# Patient Record
Sex: Female | Born: 1975 | Race: Black or African American | Hispanic: No | Smoking: Never smoker
Health system: Southern US, Community
[De-identification: ages and names within clinical notes are randomized; demographics above are authoritative.]

## PROBLEM LIST (undated history)

## (undated) DIAGNOSIS — D332 Benign neoplasm of brain, unspecified: Secondary | ICD-10-CM

## (undated) DIAGNOSIS — R569 Unspecified convulsions: Secondary | ICD-10-CM

## (undated) DIAGNOSIS — Z5189 Encounter for other specified aftercare: Secondary | ICD-10-CM

## (undated) HISTORY — DX: Unspecified convulsions: R56.9

## (undated) HISTORY — PX: MUSCLE BIOPSY: SHX716

---

## 2004-08-04 ENCOUNTER — Emergency Department: Payer: Self-pay | Admitting: Emergency Medicine

## 2005-06-21 ENCOUNTER — Emergency Department: Payer: Self-pay | Admitting: Emergency Medicine

## 2006-04-24 ENCOUNTER — Emergency Department (HOSPITAL_COMMUNITY): Admission: EM | Admit: 2006-04-24 | Discharge: 2006-04-24 | Payer: Self-pay | Admitting: Pediatrics

## 2006-08-21 ENCOUNTER — Emergency Department: Payer: Self-pay | Admitting: Emergency Medicine

## 2007-01-24 ENCOUNTER — Emergency Department: Payer: Self-pay | Admitting: Emergency Medicine

## 2007-03-28 ENCOUNTER — Emergency Department: Payer: Self-pay | Admitting: Emergency Medicine

## 2008-02-03 ENCOUNTER — Emergency Department: Payer: Self-pay | Admitting: Emergency Medicine

## 2008-05-21 HISTORY — PX: BRAIN SURGERY: SHX531

## 2008-09-29 ENCOUNTER — Emergency Department (HOSPITAL_COMMUNITY): Admission: EM | Admit: 2008-09-29 | Discharge: 2008-09-29 | Payer: Self-pay | Admitting: Emergency Medicine

## 2009-01-29 ENCOUNTER — Emergency Department: Payer: Self-pay | Admitting: Emergency Medicine

## 2009-11-01 ENCOUNTER — Emergency Department: Payer: Self-pay | Admitting: Emergency Medicine

## 2010-06-09 ENCOUNTER — Emergency Department: Payer: Self-pay | Admitting: Emergency Medicine

## 2010-12-28 ENCOUNTER — Emergency Department (HOSPITAL_COMMUNITY)
Admission: EM | Admit: 2010-12-28 | Discharge: 2010-12-28 | Disposition: A | Discharge status: Home / Self Care | Payer: Medicare Other | Attending: Emergency Medicine | Admitting: Emergency Medicine

## 2010-12-28 DIAGNOSIS — S01502A Unspecified open wound of oral cavity, initial encounter: Secondary | ICD-10-CM | POA: Insufficient documentation

## 2010-12-28 DIAGNOSIS — G40909 Epilepsy, unspecified, not intractable, without status epilepticus: Secondary | ICD-10-CM | POA: Insufficient documentation

## 2010-12-28 DIAGNOSIS — Z79899 Other long term (current) drug therapy: Secondary | ICD-10-CM | POA: Insufficient documentation

## 2010-12-28 DIAGNOSIS — W503XXA Accidental bite by another person, initial encounter: Secondary | ICD-10-CM | POA: Insufficient documentation

## 2010-12-28 DIAGNOSIS — R6889 Other general symptoms and signs: Secondary | ICD-10-CM | POA: Insufficient documentation

## 2010-12-28 LAB — CARBAMAZEPINE LEVEL, TOTAL: Carbamazepine Lvl: <0.5 ug/mL — ABNORMAL LOW (ref 4.0–12.0)

## 2012-11-06 ENCOUNTER — Encounter: Payer: Self-pay | Admitting: General Surgery

## 2012-11-06 ENCOUNTER — Ambulatory Visit (INDEPENDENT_AMBULATORY_CARE_PROVIDER_SITE_OTHER): Payer: Medicare Other | Admitting: General Surgery

## 2012-11-06 VITALS — BP 118/82 | HR 72 | Resp 14 | Ht 65.0 in | Wt 168.0 lb

## 2012-11-06 DIAGNOSIS — N6459 Other signs and symptoms in breast: Secondary | ICD-10-CM

## 2012-11-06 DIAGNOSIS — N6452 Nipple discharge: Secondary | ICD-10-CM

## 2012-11-06 NOTE — Patient Instructions (Addendum)
Patient to return as needed. If things charge give [place] an call. Patient to resume mammograms at age 37.

## 2012-11-06 NOTE — Progress Notes (Signed)
Patient ID: Sheila Moore, female   DOB: [birth year], 37 y.o.   MRN: [id number]  Chief Complaint  Patient presents with  . Breast Discharge    new patient bilateral nipple discharge    HPI Sheila Moore is a 37 y.o. female who presents today for an evaluation of bilateral nipple discharge referred by Lyndel Pleasure PA. Patient states this has been going on for 3 months was gray at first now greenish . No family history of breast cancer. Last mammogram done at [facility] on May 20,2014 category t2. Never had any breast problems before. Patient dose perform self breast checks.  HPI  Past Medical History  Diagnosis Date  . Seizures     Past Surgical History  Procedure Laterality Date  . 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 No pertinent family history.  Social History History  Substance Use Topics  . Smoking status: Not on file  . Smokeless tobacco: Never Used  . Alcohol Use: No    No Known Allergies  Current Outpatient Prescriptions  Medication Sig Dispense Refill  . carbamazepine (CARBATROL) 300 MG 12 hr capsule Take 1 capsule by mouth daily.      Marland Kitchen levETIRAcetam (KEPPRA) 500 MG tablet Take 1 tablet by mouth 2 (two) times daily.      . QUEtiapine (SEROQUEL) 25 MG tablet Take 1 tablet by mouth daily.       No current facility-administered medications for this visit.    Review of Systems Review of Systems  Constitutional: Negative.   Respiratory: Negative.   Cardiovascular: Negative.     Blood pressure 118/82, pulse 72, resp. rate 14, height 5\' 5"  (1.651 m), weight 168 lb (76.204 kg), last menstrual period 09/24/2012.  Physical Exam Physical Exam  Constitutional: She appears well-developed and well-nourished.  Eyes: No scleral icterus.  Cardiovascular: Normal rate, regular rhythm and normal heart sounds.   Pulmonary/Chest: Breath sounds normal. Right breast exhibits no inverted nipple, no mass, no skin change and no tenderness. Nipple discharge:   dark green negative for blood when tested. Left breast exhibits no inverted nipple, no mass, no nipple discharge, no skin change and no tenderness.  Lymphadenopathy:    She has no cervical adenopathy.    She has no axillary adenopathy.  Neurological: She is alert.  Skin: Skin is warm and dry.    Data Reviewed Mammogram reviewed cat 2  Assessment     benign nipple discharge. Pt was advised on this. If it becomes more of an issue will reevaluate    Plan    Patient to return as needed.        Rebekkah Powless G 11/06/2012, 7:03 PM

## 2013-01-30 ENCOUNTER — Emergency Department (HOSPITAL_COMMUNITY)
Admission: EM | Admit: 2013-01-30 | Discharge: 2013-01-30 | Disposition: A | Discharge status: Home / Self Care | Payer: No Typology Code available for payment source | Attending: Emergency Medicine | Admitting: Emergency Medicine

## 2013-01-30 ENCOUNTER — Encounter (HOSPITAL_COMMUNITY): Payer: Self-pay | Admitting: Emergency Medicine

## 2013-01-30 ENCOUNTER — Emergency Department (HOSPITAL_COMMUNITY): Payer: No Typology Code available for payment source

## 2013-01-30 DIAGNOSIS — Y929 Unspecified place or not applicable: Secondary | ICD-10-CM | POA: Insufficient documentation

## 2013-01-30 DIAGNOSIS — S60222A Contusion of left hand, initial encounter: Secondary | ICD-10-CM

## 2013-01-30 DIAGNOSIS — Z79899 Other long term (current) drug therapy: Secondary | ICD-10-CM | POA: Insufficient documentation

## 2013-01-30 DIAGNOSIS — S60229A Contusion of unspecified hand, initial encounter: Secondary | ICD-10-CM | POA: Insufficient documentation

## 2013-01-30 DIAGNOSIS — R609 Edema, unspecified: Secondary | ICD-10-CM | POA: Insufficient documentation

## 2013-01-30 DIAGNOSIS — G40909 Epilepsy, unspecified, not intractable, without status epilepticus: Secondary | ICD-10-CM | POA: Insufficient documentation

## 2013-01-30 DIAGNOSIS — Y9389 Activity, other specified: Secondary | ICD-10-CM | POA: Insufficient documentation

## 2013-01-30 MED ORDER — HYDROCODONE-ACETAMINOPHEN 5-325 MG PO TABS
2.0000 | ORAL_TABLET | Freq: Once | ORAL | Status: AC
  Administered 2013-01-30: 2 via ORAL
  Filled 2013-01-30: qty 2

## 2013-01-30 MED ORDER — IBUPROFEN 600 MG PO TABS: 600.0000 mg | ORAL_TABLET | Freq: Four times a day (QID) | ORAL | Status: DC | PRN

## 2013-01-30 NOTE — Progress Notes (Signed)
[facility] did not get to see patient but will be sending information about the [employer] card program, using the address provided.
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
## 2014-03-22 ENCOUNTER — Encounter (HOSPITAL_COMMUNITY): Payer: Self-pay | Admitting: Emergency Medicine

## 2015-06-03 DIAGNOSIS — Z85841 Personal history of malignant neoplasm of brain: Secondary | ICD-10-CM | POA: Diagnosis not present

## 2015-06-03 DIAGNOSIS — G40119 Localization-related (focal) (partial) symptomatic epilepsy and epileptic syndromes with simple partial seizures, intractable, without status epilepticus: Secondary | ICD-10-CM | POA: Diagnosis not present

## 2015-06-03 DIAGNOSIS — G40109 Localization-related (focal) (partial) symptomatic epilepsy and epileptic syndromes with simple partial seizures, not intractable, without status epilepticus: Secondary | ICD-10-CM | POA: Diagnosis not present

## 2015-06-03 DIAGNOSIS — Z9889 Other specified postprocedural states: Secondary | ICD-10-CM | POA: Diagnosis not present

## 2016-05-19 ENCOUNTER — Emergency Department (HOSPITAL_COMMUNITY)
Admission: EM | Admit: 2016-05-19 | Discharge: 2016-05-19 | Disposition: A | Discharge status: Home / Self Care | Payer: Medicare Other | Attending: Physician Assistant | Admitting: Physician Assistant

## 2016-05-19 ENCOUNTER — Encounter (HOSPITAL_COMMUNITY): Payer: Self-pay

## 2016-05-19 DIAGNOSIS — T23001A Burn of unspecified degree of right hand, unspecified site, initial encounter: Secondary | ICD-10-CM | POA: Diagnosis present

## 2016-05-19 DIAGNOSIS — Y999 Unspecified external cause status: Secondary | ICD-10-CM | POA: Insufficient documentation

## 2016-05-19 DIAGNOSIS — Y939 Activity, unspecified: Secondary | ICD-10-CM | POA: Diagnosis not present

## 2016-05-19 DIAGNOSIS — T23202A Burn of second degree of left hand, unspecified site, initial encounter: Secondary | ICD-10-CM | POA: Diagnosis not present

## 2016-05-19 DIAGNOSIS — Y929 Unspecified place or not applicable: Secondary | ICD-10-CM | POA: Insufficient documentation

## 2016-05-19 DIAGNOSIS — T310 Burns involving less than 10% of body surface: Secondary | ICD-10-CM | POA: Diagnosis not present

## 2016-05-19 DIAGNOSIS — X102XXA Contact with fats and cooking oils, initial encounter: Secondary | ICD-10-CM | POA: Insufficient documentation

## 2016-05-19 DIAGNOSIS — T2122XA Burn of second degree of abdominal wall, initial encounter: Secondary | ICD-10-CM | POA: Insufficient documentation

## 2016-05-19 DIAGNOSIS — T23291A Burn of second degree of multiple sites of right wrist and hand, initial encounter: Secondary | ICD-10-CM | POA: Insufficient documentation

## 2016-05-19 DIAGNOSIS — T300 Burn of unspecified body region, unspecified degree: Secondary | ICD-10-CM

## 2016-05-19 DIAGNOSIS — T23201A Burn of second degree of right hand, unspecified site, initial encounter: Secondary | ICD-10-CM | POA: Diagnosis not present

## 2016-05-19 DIAGNOSIS — T23292A Burn of second degree of multiple sites of left wrist and hand, initial encounter: Secondary | ICD-10-CM | POA: Diagnosis not present

## 2016-05-19 MED ORDER — MORPHINE SULFATE (PF) 4 MG/ML IV SOLN
4.0000 mg | Freq: Once | INTRAVENOUS | Status: AC
  Administered 2016-05-19: 4 mg via INTRAVENOUS
  Filled 2016-05-19: qty 1

## 2016-05-19 MED ORDER — SILVER SULFADIAZINE 1 % EX CREA
1.0000 | TOPICAL_CREAM | Freq: Every day | CUTANEOUS | 0 refills | Status: DC
Start: 2016-05-19 — End: 2016-08-10

## 2016-05-19 MED ORDER — SILVER SULFADIAZINE 1 % EX CREA
TOPICAL_CREAM | Freq: Once | CUTANEOUS | Status: AC
  Administered 2016-05-19: 18:00:00 via TOPICAL
  Filled 2016-05-19: qty 85

## 2016-05-19 MED ORDER — ONDANSETRON HCL 4 MG/2ML IJ SOLN
4.0000 mg | Freq: Once | INTRAMUSCULAR | Status: AC
  Administered 2016-05-19: 4 mg via INTRAVENOUS
  Filled 2016-05-19: qty 2

## 2016-05-19 MED ORDER — MORPHINE SULFATE (PF) 4 MG/ML IV SOLN
4.0000 mg | Freq: Once | INTRAVENOUS | Status: AC
Start: 2016-05-19 — End: 2016-05-19
  Administered 2016-05-19: 4 mg via INTRAVENOUS
  Filled 2016-05-19: qty 1

## 2016-05-19 MED ORDER — NAPROXEN 500 MG PO TABS: 500.0000 mg | ORAL_TABLET | Freq: Two times a day (BID) | ORAL | 0 refills | Status: DC

## 2016-05-19 MED ORDER — SODIUM CHLORIDE 0.9 % IV BOLUS (SEPSIS)
1000.0000 mL | Freq: Once | INTRAVENOUS | Status: AC
  Administered 2016-05-19: 1000 mL via INTRAVENOUS

## 2016-05-19 MED ORDER — OXYCODONE-ACETAMINOPHEN 5-325 MG PO TABS: 1.0000 | ORAL_TABLET | Freq: Four times a day (QID) | ORAL | 0 refills | Status: DC | PRN
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
## 2016-05-23 ENCOUNTER — Observation Stay: Payer: Medicare Other

## 2016-05-23 ENCOUNTER — Observation Stay
Admission: EM | Admit: 2016-05-23 | Discharge: 2016-05-24 | Disposition: A | Discharge status: Home / Self Care | Payer: Medicare Other | Attending: Obstetrics & Gynecology | Admitting: Obstetrics & Gynecology

## 2016-05-23 DIAGNOSIS — Z79899 Other long term (current) drug therapy: Secondary | ICD-10-CM | POA: Insufficient documentation

## 2016-05-23 DIAGNOSIS — N939 Abnormal uterine and vaginal bleeding, unspecified: Secondary | ICD-10-CM

## 2016-05-23 DIAGNOSIS — D62 Acute posthemorrhagic anemia: Secondary | ICD-10-CM | POA: Diagnosis not present

## 2016-05-23 DIAGNOSIS — N921 Excessive and frequent menstruation with irregular cycle: Secondary | ICD-10-CM | POA: Diagnosis not present

## 2016-05-23 DIAGNOSIS — N920 Excessive and frequent menstruation with regular cycle: Secondary | ICD-10-CM | POA: Diagnosis present

## 2016-05-23 DIAGNOSIS — R569 Unspecified convulsions: Secondary | ICD-10-CM | POA: Insufficient documentation

## 2016-05-23 DIAGNOSIS — D649 Anemia, unspecified: Secondary | ICD-10-CM | POA: Diagnosis present

## 2016-05-23 HISTORY — DX: Encounter for other specified aftercare: Z51.89

## 2016-05-23 LAB — CBC WITH DIFFERENTIAL/PLATELET
BAND NEUTROPHILS: 0 %
BASOS ABS: 0 10*3/uL (ref 0–0.1)
Basophils Relative: 0 %
Blasts: 0 %
EOS ABS: 0.1 10*3/uL (ref 0–0.7)
EOS PCT: 1 %
HEMATOCRIT: 16.5 % — AB (ref 35.0–47.0)
Hemoglobin: 4.8 g/dL — CL (ref 12.0–16.0)
LYMPHS ABS: 0.9 10*3/uL — AB (ref 1.0–3.6)
Lymphocytes Relative: 13 %
MCH: 17.1 pg — ABNORMAL LOW (ref 26.0–34.0)
MCHC: 29 g/dL — AB (ref 32.0–36.0)
MCV: 58.8 fL — ABNORMAL LOW (ref 80.0–100.0)
METAMYELOCYTES PCT: 0 %
MONO ABS: 0.9 10*3/uL (ref 0.2–0.9)
MYELOCYTES: 0 %
Monocytes Relative: 13 %
Neutro Abs: 4.8 10*3/uL (ref 1.4–6.5)
Neutrophils Relative %: 73 %
Other: 0 %
PLATELETS: 352 10*3/uL (ref 150–440)
Promyelocytes Absolute: 0 %
RBC: 2.8 MIL/uL — AB (ref 3.80–5.20)
RDW: 18.4 % — AB (ref 11.5–14.5)
Smear Review: ADEQUATE
WBC: 6.7 10*3/uL (ref 3.6–11.0)
nRBC: 0 /100 WBC

## 2016-05-23 LAB — BASIC METABOLIC PANEL
ANION GAP: 8 (ref 5–15)
BUN: 10 mg/dL (ref 6–20)
CALCIUM: 9.6 mg/dL (ref 8.9–10.3)
CO2: 25 mmol/L (ref 22–32)
CREATININE: 0.68 mg/dL (ref 0.44–1.00)
Chloride: 103 mmol/L (ref 101–111)
Glucose, Bld: 116 mg/dL — ABNORMAL HIGH (ref 65–99)
Potassium: 3.6 mmol/L (ref 3.5–5.1)
SODIUM: 136 mmol/L (ref 135–145)

## 2016-05-23 LAB — ABO/RH: ABO/RH(D): O POS

## 2016-05-23 LAB — HCG, QUANTITATIVE, PREGNANCY: hCG, Beta Chain, Quant, S: <1 m[IU]/mL (ref <5)

## 2016-05-23 MED ORDER — LEVETIRACETAM 500 MG PO TABS
500.0000 mg | ORAL_TABLET | Freq: Two times a day (BID) | ORAL | Status: DC
  Administered 2016-05-23 – 2016-05-24 (×2): 500 mg via ORAL
  Filled 2016-05-23 (×2): qty 1

## 2016-05-23 MED ORDER — CARBAMAZEPINE ER 100 MG PO TB12
300.0000 mg | ORAL_TABLET | Freq: Every day | ORAL | Status: DC
  Administered 2016-05-23 – 2016-05-24 (×2): 300 mg via ORAL
  Filled 2016-05-23: qty 1
  Filled 2016-05-23 (×2): qty 3

## 2016-05-23 MED ORDER — ACETAMINOPHEN 325 MG PO TABS
650.0000 mg | ORAL_TABLET | Freq: Four times a day (QID) | ORAL | Status: DC | PRN
  Administered 2016-05-23: 650 mg via ORAL
  Filled 2016-05-23: qty 2

## 2016-05-23 MED ORDER — SODIUM CHLORIDE 0.9 % IV SOLN
10.0000 mL/h | Freq: Once | INTRAVENOUS | Status: DC
  Administered 2016-05-23: 10 mL/h via INTRAVENOUS

## 2016-05-23 MED ORDER — IBUPROFEN 600 MG PO TABS: 600.0000 mg | ORAL_TABLET | Freq: Four times a day (QID) | ORAL | Status: DC | PRN

## 2016-05-23 MED ORDER — SIMETHICONE 80 MG PO CHEW: 80.0000 mg | CHEWABLE_TABLET | Freq: Four times a day (QID) | ORAL | Status: DC | PRN

## 2016-05-23 MED ORDER — SILVER SULFADIAZINE 1 % EX CREA
1.0000 "application " | TOPICAL_CREAM | Freq: Every day | CUTANEOUS | Status: DC
  Administered 2016-05-23: 1 via TOPICAL
  Filled 2016-05-23: qty 85

## 2016-05-23 MED ORDER — ALUM & MAG HYDROXIDE-SIMETH 200-200-20 MG/5ML PO SUSP: 30.0000 mL | ORAL | Status: DC | PRN

## 2016-05-23 MED ORDER — LACTATED RINGERS IV SOLN
INTRAVENOUS | Status: DC
  Administered 2016-05-23: 23:00:00 via INTRAVENOUS

## 2016-05-23 MED ORDER — MEDROXYPROGESTERONE ACETATE 10 MG PO TABS
10.0000 mg | ORAL_TABLET | Freq: Two times a day (BID) | ORAL | Status: DC
  Administered 2016-05-23 – 2016-05-24 (×2): 10 mg via ORAL
  Filled 2016-05-23 (×3): qty 1

## 2016-05-23 MED ORDER — DIPHENHYDRAMINE HCL 25 MG PO CAPS
25.0000 mg | ORAL_CAPSULE | Freq: Four times a day (QID) | ORAL | Status: DC | PRN
  Administered 2016-05-23: 25 mg via ORAL
  Filled 2016-05-23: qty 1

## 2016-05-23 MED ORDER — FERROUS FUMARATE 324 (106 FE) MG PO TABS
1.0000 | ORAL_TABLET | Freq: Two times a day (BID) | ORAL | Status: DC
  Administered 2016-05-23 – 2016-05-24 (×2): 106 mg via ORAL
  Filled 2016-05-23 (×2): qty 1

## 2016-05-23 NOTE — H&P (Signed)
Obstetrics & Gynecology H&P Note  Date of Admission: 05/23/2016   Requesting Provider: [facility]  Primary OBGYN: None, new to area Primary Care Provider: No PCP Per Patient  CC: Anemia and menometrorrhagia  History of Present Illness: Sheila Moore is a 41 y.o. [id number] (Patient's last menstrual period was 04/17/2016.), with the above CC. She reports reg cycles until November at which time she has noted daily bleeding from Nov 28th until now; some days worse than others, sometimes clots, often bright red blood, pads daily; mild cramping; recent 5 days of more dizziness and fatigue.  No prior h/o fibroids, AUB, other bleeding.  Prior NSVD 20 yrs ago and CS w BTL 13 yrs ago.  Developed seizure d/o with first pregnancy that has persisted but is currently well controlled.  ROS: A 12-point review of systems was performed and negative, except as stated in the above HPI.  OBGYN History: As per HPI. OB History    Gravida Para Term Preterm AB Living   2 2  1  1  2    SAB TAB Ectopic Multiple Live Births                 Obstetric Comments   1st Menstrual Cycle:  11 1st Pregnancy:  19     no history of STIs.   She is currently using BTL for contraception.  no HRT use.    Past Medical History: Past Medical History:  Diagnosis Date  . Blood transfusion without reported diagnosis   . Seizures ([place])     Past Surgical History: Past Surgical History:  Procedure Laterality Date  . BRAIN SURGERY  2010   tumor removed   - Cesarean and Tubal  Family History:  No family history on file. She denies any female cancers, bleeding or blood clotting disorders.   Social History:  Social History   Social History  . Marital status: Married    Spouse name:   . Number of children: 2  . Years of education: N/A   Occupational History  . Not on file.   Social History Main Topics  . Smoking status: Never Smoker  . Smokeless tobacco: Never Used  . Alcohol use No  . Drug use: No  . Sexual  activity: Not on file   Allergy: No Known Allergies  Current Outpatient Medications:  (Not in a hospital admission)   Hospital Medications: Current Facility-Administered Medications  Medication Dose Route Frequency Provider Last Rate Last Dose  . alum & mag hydroxide-simeth (MAALOX/MYLANTA) 200-200-20 MG/5ML suspension 30 mL  30 mL Oral Q4H PRN Gae Dry, MD      . carbamazepine (CARBATROL) 12 hr capsule 300 mg  300 mg Oral Daily Gae Dry, MD      . Ferrous Fumarate (HEMOCYTE - 106 mg FE) tablet 106 mg of iron  1 tablet Oral BID Gae Dry, MD      . ibuprofen (ADVIL,MOTRIN) tablet 600 mg  600 mg Oral Q6H PRN Gae Dry, MD      . lactated ringers infusion   Intravenous Continuous Gae Dry, MD      . levETIRAcetam (KEPPRA) tablet 500 mg  500 mg Oral BID Gae Dry, MD      . medroxyPROGESTERone (PROVERA) tablet 10 mg  10 mg Oral BID Gae Dry, MD      . silver sulfADIAZINE (SILVADENE) 1 % cream 1 application  1 application Topical Daily Gae Dry, MD      .  simethicone (MYLICON) chewable tablet 80 mg  80 mg Oral QID PRN Gae Dry, MD       Current Outpatient Prescriptions  Medication Sig Dispense Refill  . carbamazepine (CARBATROL) 300 MG 12 hr capsule Take 1 capsule by mouth daily.    Marland Kitchen ibuprofen (ADVIL,MOTRIN) 600 MG tablet Take 1 tablet (600 mg total) by mouth every 6 (six) hours as needed for pain. 30 tablet 0  . levETIRAcetam (KEPPRA) 500 MG tablet Take 500 mg by mouth 2 (two) times daily.     . naproxen (NAPROSYN) 500 MG tablet Take 1 tablet (500 mg total) by mouth 2 (two) times daily. 30 tablet 0  . naproxen sodium (ANAPROX) 220 MG tablet Take 220 mg by mouth daily as needed (for pain).    Marland Kitchen oxyCODONE-acetaminophen (PERCOCET/ROXICET) 5-325 MG tablet Take 1-2 tablets by mouth every 6 (six) hours as needed for severe pain. 30 tablet 0  . silver sulfADIAZINE (SILVADENE) 1 % cream Apply 1 application topically daily. 50 g 0      Physical Exam: Vitals:   05/23/16 1335 05/23/16 1336  BP: (!) 159/88   Pulse: 96   Resp: 16   Temp: 98.2 F (36.8 C)   TempSrc: Oral   SpO2: 100%   Weight:  175 lb (79.4 kg)  Height:  5\' 5"  (1.651 m)   Body mass index is 29.12 kg/m. General appearance: Well nourished, well developed female in no acute distress.  Neck:  Supple, normal appearance, and no thyromegaly  Cardiovascular:Regular rate and rhythm.  No murmurs, rubs or gallops. Respiratory:  Clear to auscultation bilateral. Normal respiratory effort Abdomen: positive bowel sounds and no masses, hernias; diffusely non tender to palpation, non distended Neuro/Psych:  Normal mood and affect.  Skin:  Warm and dry.  Lymphatic:  No inguinal lymphadenopathy.   Recent Labs Lab 05/23/16 1338  WBC 6.7  HGB 4.8*  HCT 16.5*  PLT 352    Recent Labs Lab 05/23/16 1338  NA 136  K 3.6  CL 103  CO2 25  BUN 10  CREATININE 0.68  CALCIUM 9.6  GLUCOSE 116*    Recent Labs Lab 05/23/16 1434  [place] O POS    Imaging:  [place] pending  Assessment: Sheila Moore is a 41 y.o. [id number] (Patient's last menstrual period was 04/17/2016.) who presented to the ED with complaints of menometrorrhagia and dizziness; findings are consistent with anemia.  Hormonal vs anatomical etiology for menometrorrhagia discussed.  [place] to help define.  Provera to short term treat.  Transfusion for severe anemia.  Barnett Applebaum, MD [facility] OBGYN Pager [phone]

## 2016-05-23 NOTE — ED Notes (Signed)
Resumed care from Counce rn.  Pt alert.  Iv in place. nsr on monitor.  Skin warm and dry.  No acute resp distress.  No chest pain.

## 2016-05-23 NOTE — ED Provider Notes (Signed)
[facility] Emergency Department Provider Note  ____________________________________________  Time seen: Approximately 2:48 PM  I have reviewed the triage vital signs and the nursing notes.   HISTORY  Chief Complaint Dizziness and Vaginal Bleeding   HPI Sheila Moore is a 41 y.o. female G2P2 resents for evaluation of dizziness and dyspnea on exertion in the setting of vaginal bleeding. Patient reports greater than 30 days of constant heavy bright red vaginal bleeding with intermittent large clots. She denies prior history of abnormal menstrual periods. She reports that she is passing large clots. For the last 5 days she has had fatigue, dizziness, and today started having dyspnea on exertion. Not on OCP. No CP, no abdominal pain.   Past Medical History:  Diagnosis Date  . Blood transfusion without reported diagnosis   . Seizures [facility])     Patient Active Problem List   Diagnosis Date Noted  . Menorrhagia 05/23/2016  . Anemia 05/23/2016  . Nipple discharge 11/06/2012    Past Surgical History:  Procedure Laterality Date  . BRAIN SURGERY  2010   tumor removed    Prior to Admission medications   Medication Sig Start Date End Date Taking? Authorizing Provider  carbamazepine (CARBATROL) 300 MG 12 hr capsule Take 1 capsule by mouth daily. 10/27/12  Yes Historical Provider, MD  ibuprofen (ADVIL,MOTRIN) 600 MG tablet Take 1 tablet (600 mg total) by mouth every 6 (six) hours as needed for pain. 01/30/13  Yes Ernestina Patches, MD  levETIRAcetam (KEPPRA) 500 MG tablet Take 500 mg by mouth 2 (two) times daily.  10/28/12  Yes Historical Provider, MD  silver sulfADIAZINE (SILVADENE) 1 % cream Apply 1 application topically daily. 05/19/16  Yes Shawn C Joy, PA-C    Allergies Patient has no known allergies.  No family history on file.  Social History Social History  Substance Use Topics  . Smoking status: Never Smoker  . Smokeless tobacco: Never Used  .  Alcohol use No    Review of Systems  Constitutional: Negative for fever. + fatigue and dizziness Eyes: Negative for visual changes. ENT: Negative for sore throat. Neck: No neck pain  Cardiovascular: Negative for chest pain. Respiratory: Negative for shortness of breath. Gastrointestinal: Negative for abdominal pain, vomiting or diarrhea. Genitourinary: Negative for dysuria. + vaginal bleeding Musculoskeletal: Negative for back pain. Skin: Negative for rash. Neurological: Negative for headaches, weakness or numbness. Psych: No SI or HI  ____________________________________________   PHYSICAL EXAM:  VITAL SIGNS: ED Triage Vitals  Enc Vitals Group     BP 05/23/16 1335 (!) 159/88     Pulse Rate 05/23/16 1335 96     Resp 05/23/16 1335 16     Temp 05/23/16 1335 98.2 F (36.8 C)     Temp Source 05/23/16 1335 Oral     SpO2 05/23/16 1335 100 %     Weight 05/23/16 1336 175 lb (79.4 kg)     Height 05/23/16 1336 5\' 5"  (1.651 m)     Head Circumference --      Peak Flow --      Pain Score 05/23/16 1336 7     Pain Loc --      Pain Edu? --      Excl. in [place]? --     Constitutional: Alert and oriented. Well appearing and in no apparent distress. HEENT:      Head: Normocephalic and atraumatic.         Eyes: Conjunctivae are normal. Sclera is non-icteric. EOMI. PERRL  Mouth/Throat: Mucous membranes are moist.       Neck: Supple with no signs of meningismus. Cardiovascular: Regular rate and rhythm. No murmurs, gallops, or rubs. 2+ symmetrical distal pulses are present in all extremities. No JVD. Respiratory: Normal respiratory effort. Lungs are clear to auscultation bilaterally. No wheezes, crackles, or rhonchi.  Gastrointestinal: Soft, non tender, and non distended with positive bowel sounds. No rebound or guarding. Musculoskeletal: Nontender with normal range of motion in all extremities. No edema, cyanosis, or erythema of extremities. Neurologic: Normal speech and language. Face  is symmetric. Moving all extremities. No gross focal neurologic deficits are appreciated. Skin: Skin is warm, dry and intact. No rash noted. Psychiatric: Mood and affect are normal. Speech and behavior are normal.  ____________________________________________   LABS (all labs ordered are listed, but only abnormal results are displayed)  Labs Reviewed  CBC WITH DIFFERENTIAL/PLATELET - Abnormal; Notable for the following:       Result Value   RBC 2.80 (*)    Hemoglobin 4.8 (*)    HCT 16.5 (*)    MCV 58.8 (*)    MCH 17.1 (*)    MCHC 29.0 (*)    RDW 18.4 (*)    Lymphs Abs 0.9 (*)    All other components within normal limits  BASIC METABOLIC PANEL - Abnormal; Notable for the following:    Glucose, Bld 116 (*)    All other components within normal limits  HCG, QUANTITATIVE, PREGNANCY  CBC  TYPE AND SCREEN  PREPARE RBC (CROSSMATCH)  ABO/RH   ____________________________________________  EKG  none ____________________________________________  RADIOLOGY  TVUS: PND ____________________________________________   PROCEDURES  Procedure(s) performed:None Procedures Critical Care performed: yes  CRITICAL CARE Performed by: Rudene Re  ?  Total critical care time: 35 min  Critical care time was exclusive of separately billable procedures and treating other patients.  Critical care was necessary to treat or prevent imminent or life-threatening deterioration.  Critical care was time spent personally by me on the following activities: development of treatment plan with patient and/or surrogate as well as nursing, discussions with consultants, evaluation of patient's response to treatment, examination of patient, obtaining history from patient or surrogate, ordering and performing treatments and interventions, ordering and review of laboratory studies, ordering and review of radiographic studies, pulse oximetry and re-evaluation of patient's  condition.  ____________________________________________   INITIAL IMPRESSION / ASSESSMENT AND PLAN / ED COURSE  41 y.o. female G2P2 resents for evaluation of dizziness and dyspnea on exertion in the setting of vaginal bleeding > 30 days. VS WNL. Hgb 4.8. Patient consented for transfusion. Will give 2U. Discussed with Dr. Kenton Kingfisher, obgyn who    Clinical Course     Pertinent labs & imaging results that were available during my care of the patient were reviewed by me and considered in my medical decision making (see chart for details).    ____________________________________________   FINAL CLINICAL IMPRESSION(S) / ED DIAGNOSES  Final diagnoses:  Vaginal bleeding  Acute blood loss anemia      NEW MEDICATIONS STARTED DURING THIS VISIT:  Current Discharge Medication List       Note:  This document was prepared using Dragon voice recognition software and may include unintentional dictation errors.    Rudene Re, MD 05/23/16 2002

## 2016-05-23 NOTE — ED Notes (Addendum)
Nurse unable to take report.  Er charge nurse heather rn aware.

## 2016-05-23 NOTE — ED Triage Notes (Signed)
Pt reports menstrual cycle since November. Pt reports weakness and fatigue X 5 days. Pt pale.

## 2016-05-23 NOTE — ED Notes (Signed)
Report called to 3rd floor nurse christy rn.

## 2016-05-23 NOTE — ED Notes (Signed)
EDP at bedside  

## 2016-05-24 DIAGNOSIS — D62 Acute posthemorrhagic anemia: Secondary | ICD-10-CM | POA: Diagnosis not present

## 2016-05-24 DIAGNOSIS — N921 Excessive and frequent menstruation with irregular cycle: Secondary | ICD-10-CM | POA: Diagnosis not present

## 2016-05-24 LAB — CBC
HEMATOCRIT: 22.1 % — AB (ref 35.0–47.0)
Hemoglobin: 6.9 g/dL — ABNORMAL LOW (ref 12.0–16.0)
MCH: 20.8 pg — ABNORMAL LOW (ref 26.0–34.0)
MCHC: 31.3 g/dL — AB (ref 32.0–36.0)
MCV: 66.4 fL — ABNORMAL LOW (ref 80.0–100.0)
PLATELETS: 272 10*3/uL (ref 150–440)
RBC: 3.32 MIL/uL — ABNORMAL LOW (ref 3.80–5.20)
RDW: 25.6 % — AB (ref 11.5–14.5)
WBC: 6.8 10*3/uL (ref 3.6–11.0)

## 2016-05-24 MED ORDER — FERROUS FUMARATE 324 (106 FE) MG PO TABS: 1.0000 | ORAL_TABLET | Freq: Two times a day (BID) | ORAL | 2 refills | Status: DC

## 2016-05-24 MED ORDER — MEDROXYPROGESTERONE ACETATE 10 MG PO TABS: 10.0000 mg | ORAL_TABLET | Freq: Every day | ORAL | 1 refills | Status: DC

## 2016-05-24 NOTE — Discharge Summary (Signed)
Physician Discharge Summary  Patient ID: Sheila Moore MRN: [id number] DOB/AGE: Oct 09, 1975 41 y.o.  Admit date: 05/23/2016 Discharge date: 05/24/2016  Admission Diagnoses: Anemia, Menometrorrhagia  Discharge Diagnoses:  Principal Problem:   Menorrhagia Active Problems:   Anemia   Discharged Condition: good  Hospital Course: Pt seen and examined for anemia and menometrorrhagia.  US done (no report but I reviewed images and no apparent uterine abnormalities),.  Received transfusion 2 units.  Hgb 6.9 this am. Provera started.  Fe.  Follow up disucssed.  Consults: None  Significant Diagnostic Studies: labs: see labs  Treatments: IV hydration and transfusion  Discharge Exam: Blood pressure 104/71, pulse 67, temperature 98.3 F (36.8 C), temperature source Oral, resp. rate 18, height 5\' 5"  (1.651 m), weight 175 lb (79.4 kg), last menstrual period 04/17/2016, SpO2 99 %. General appearance: alert, cooperative and no distress Resp: clear to auscultation bilaterally Cardio: regular rate and rhythm, S1, S2 normal, no murmur, click, rub or gallop GI: soft, non-tender; bowel sounds normal; no masses,  no organomegaly Extremities: extremities normal, atraumatic, no cyanosis or edema Pulses: 2+ and symmetric  Disposition: 01-Home or Self Care   Allergies as of 05/24/2016   No Known Allergies     Medication List    TAKE these medications   carbamazepine 300 MG 12 hr capsule Commonly known as:  CARBATROL Take 1 capsule by mouth daily.   Ferrous Fumarate 324 (106 Fe) MG Tabs tablet Commonly known as:  HEMOCYTE - 106 mg FE Take 1 tablet (106 mg of iron total) by mouth 2 (two) times daily.   ibuprofen 600 MG tablet Commonly known as:  ADVIL,MOTRIN Take 1 tablet (600 mg total) by mouth every 6 (six) hours as needed for pain.   levETIRAcetam 500 MG tablet Commonly known as:  KEPPRA Take 500 mg by mouth 2 (two) times daily.   medroxyPROGESTERone 10 MG tablet Commonly known as:   PROVERA Take 1 tablet (10 mg total) by mouth daily.   silver sulfADIAZINE 1 % cream Commonly known as:  SILVADENE Apply 1 application topically daily.        Signed: Hoyt Koch 05/24/2016, 7:33 AM

## 2016-05-24 NOTE — Discharge Instructions (Signed)
Menorrhagia Menorrhagia is when your menstrual periods are heavy or last longer than usual. Follow these instructions at home:  Only take medicine as told by your doctor.  Take any iron pills as told by your doctor. Heavy bleeding may cause low levels of iron in your body.  Do not take aspirin 1 week before or during your period. Aspirin can make the bleeding worse.  Lie down for a while if you change your tampon or pad more than once in 2 hours. This may help lessen the bleeding.  Eat a healthy diet and foods with iron. These foods include leafy green vegetables, meat, liver, eggs, and whole grain breads and cereals.  Do not try to lose weight. Wait until the heavy bleeding has stopped and your iron level is normal. Contact a doctor if:  You soak through a pad or tampon every 1 or 2 hours, and this happens every time you have a period.  You need to use pads and tampons at the same time because you are bleeding so much.  You need to change your pad or tampon during the night.  You have a period that lasts for more than 8 days.  You pass clots bigger than 1 inch (2.5 cm) wide.  You have irregular periods that happen more or less often than once a month.  You feel dizzy or pass out (faint).  You feel very weak or tired.  You feel short of breath or feel your heart is beating too fast when you exercise.  You feel sick to your stomach (nausea) and you throw up (vomit) while you are taking your medicine.  You have watery poop (diarrhea) while you are taking your medicine.  You have any problems that may be related to the medicine you are taking. Get help right away if:  You soak through 4 or more pads or tampons in 2 hours.  You have any bleeding while you are pregnant. This information is not intended to replace advice given to you by your health care provider. Make sure you discuss any questions you have with your health care provider. Document Released: 02/14/2008 Document  Revised: 10/13/2015 Document Reviewed: 11/06/2012 Elsevier Interactive Patient Education  2017 [employer]  

## 2016-05-24 NOTE — Progress Notes (Signed)
Discharge order received from doctor. Reviewed discharge instructions and prescriptions with patient and answered all questions. Follow up appointment instructions given. Patient verbalized understanding. Patient discharged home in stable condition.    Hilbert Bible, RN

## 2016-05-25 LAB — PREPARE RBC (CROSSMATCH)

## 2016-05-26 LAB — TYPE AND SCREEN
BLOOD PRODUCT EXPIRATION DATE: 201801232359
BLOOD PRODUCT EXPIRATION DATE: 201801232359
Blood Product Expiration Date: 201801232359
ISSUE DATE / TIME: 201801031738
ISSUE DATE / TIME: 201801032017
ISSUE DATE / TIME: 201801061141
UNIT TYPE AND RH: 5100
Unit Type and Rh: 5100
Unit Type and Rh: 5100

## 2016-08-10 ENCOUNTER — Encounter (HOSPITAL_COMMUNITY): Payer: Self-pay | Admitting: *Deleted

## 2016-08-10 ENCOUNTER — Emergency Department (HOSPITAL_COMMUNITY)
Admission: EM | Admit: 2016-08-10 | Discharge: 2016-08-10 | Disposition: A | Discharge status: Home / Self Care | Payer: Medicare Other | Attending: Emergency Medicine | Admitting: Emergency Medicine

## 2016-08-10 ENCOUNTER — Emergency Department (HOSPITAL_COMMUNITY): Payer: Medicare Other

## 2016-08-10 DIAGNOSIS — R519 Headache, unspecified: Secondary | ICD-10-CM

## 2016-08-10 DIAGNOSIS — R51 Headache: Secondary | ICD-10-CM | POA: Insufficient documentation

## 2016-08-10 DIAGNOSIS — D6489 Other specified anemias: Secondary | ICD-10-CM

## 2016-08-10 DIAGNOSIS — D649 Anemia, unspecified: Secondary | ICD-10-CM | POA: Insufficient documentation

## 2016-08-10 DIAGNOSIS — Z79899 Other long term (current) drug therapy: Secondary | ICD-10-CM | POA: Insufficient documentation

## 2016-08-10 DIAGNOSIS — R569 Unspecified convulsions: Secondary | ICD-10-CM | POA: Diagnosis not present

## 2016-08-10 LAB — CBC WITH DIFFERENTIAL/PLATELET
Basophils Absolute: 0.1 10*3/uL (ref 0.0–0.1)
Basophils Relative: 1 %
EOS ABS: 0.1 10*3/uL (ref 0.0–0.7)
EOS PCT: 1 %
HCT: 23.1 % — ABNORMAL LOW (ref 36.0–46.0)
HEMOGLOBIN: 6 g/dL — AB (ref 12.0–15.0)
LYMPHS PCT: 15 %
Lymphs Abs: 1 10*3/uL (ref 0.7–4.0)
MCH: 16.8 pg — ABNORMAL LOW (ref 26.0–34.0)
MCHC: 26 g/dL — ABNORMAL LOW (ref 30.0–36.0)
MCV: 64.7 fL — ABNORMAL LOW (ref 78.0–100.0)
MONO ABS: 0.9 10*3/uL (ref 0.1–1.0)
Monocytes Relative: 14 %
NEUTROS PCT: 69 %
Neutro Abs: 4.3 10*3/uL (ref 1.7–7.7)
PLATELETS: 232 10*3/uL (ref 150–400)
RBC: 3.57 MIL/uL — AB (ref 3.87–5.11)
RDW: 19.4 % — ABNORMAL HIGH (ref 11.5–15.5)
WBC: 6.4 10*3/uL (ref 4.0–10.5)

## 2016-08-10 LAB — TYPE AND SCREEN
ABO/RH(D): O POS
Antibody Screen: NEGATIVE

## 2016-08-10 LAB — BASIC METABOLIC PANEL
ANION GAP: 10 (ref 5–15)
BUN: 7 mg/dL (ref 6–20)
CHLORIDE: 105 mmol/L (ref 101–111)
CO2: 23 mmol/L (ref 22–32)
CREATININE: 0.7 mg/dL (ref 0.44–1.00)
Calcium: 9.2 mg/dL (ref 8.9–10.3)
GFR calc non Af Amer: >60 mL/min (ref >60)
Glucose, Bld: 95 mg/dL (ref 65–99)
Potassium: 3.9 mmol/L (ref 3.5–5.1)
SODIUM: 138 mmol/L (ref 135–145)

## 2016-08-10 LAB — I-STAT BETA HCG BLOOD, ED (MC, WL, AP ONLY)

## 2016-08-10 LAB — ABO/RH: ABO/RH(D): O POS

## 2016-08-10 MED ORDER — METOCLOPRAMIDE HCL 5 MG/ML IJ SOLN
10.0000 mg | Freq: Once | INTRAMUSCULAR | Status: AC
  Administered 2016-08-10: 10 mg via INTRAVENOUS
  Filled 2016-08-10: qty 2

## 2016-08-10 MED ORDER — FERROUS SULFATE 325 (65 FE) MG PO TABS: 325.0000 mg | ORAL_TABLET | Freq: Every day | ORAL | 0 refills | Status: DC

## 2016-08-10 MED ORDER — DIPHENHYDRAMINE HCL 50 MG/ML IJ SOLN
25.0000 mg | Freq: Once | INTRAMUSCULAR | Status: AC
  Administered 2016-08-10: 25 mg via INTRAVENOUS
  Filled 2016-08-10: qty 1

## 2016-08-10 MED ORDER — SODIUM CHLORIDE 0.9 % IV BOLUS (SEPSIS)
1000.0000 mL | Freq: Once | INTRAVENOUS | Status: AC
  Administered 2016-08-10: 1000 mL via INTRAVENOUS

## 2016-08-10 MED ORDER — DOCUSATE SODIUM 100 MG PO CAPS: 100.0000 mg | ORAL_CAPSULE | Freq: Two times a day (BID) | ORAL | 0 refills | Status: DC

## 2016-08-10 NOTE — ED Triage Notes (Addendum)
Pt reports hx of seizures, taking all her meds as prescribed. Reports having head pressure/tightness and change in vision this am and seizure x 2 this morning witnessed by family. Hx of brain surgery, tumor removed in 2010.

## 2016-08-10 NOTE — ED Provider Notes (Signed)
[place] DEPT Provider Note   CSN: [id number] Arrival date & time: 08/10/16  1141     History   Chief Complaint Chief Complaint  Patient presents with  . Seizures    HPI Sheila Moore is a 41 y.o. female.  Sheila Moore is a 41 y.o. Female with a history of seizures and brain tumor who presents to the ED after two seizures prior to arrival. Patient reports she has a long history of seizures and prior to having a brain tumor removed she would have up to 5 seizures a day. She reports having a brain tumor removed approximately 8 years ago and her seizures greatly reduced. She is on Keppra for seizures and is followed by a neurologist Dr. Wrenly Kendall at [facility]. She reports today she had 2 grand mal seizures. She typically only has a seizure every other week. She tells me these seizures were different today because she felt pressure in her head, and double vision. She reports the double vision has resolved, but she still reports a fontal headache. She denies falling or any injury from the seizures. She denies any changes to her medications. She reports feeling lightheaded with position change today. She denies fevers, recent illness, abdominal pain, nausea, vomiting, eye pain, neck pain, chest pain, shortness of breath, urinary symptoms, or rashes.   The history is provided by the patient and medical records. No language interpreter was used.  Seizures   Associated symptoms include headaches and visual disturbances. Pertinent negatives include no speech difficulty, no sore throat, no chest pain, no cough, no nausea, no vomiting and no diarrhea.    Past Medical History:  Diagnosis Date  . Blood transfusion without reported diagnosis   . Seizures [facility])     Patient Active Problem List   Diagnosis Date Noted  . Menorrhagia 05/23/2016  . Anemia 05/23/2016  . Nipple discharge 11/06/2012    Past Surgical History:  Procedure Laterality Date  . BRAIN SURGERY  2010   tumor  removed    OB History    Gravida Para Term Preterm AB Living   2 1     1 1    SAB TAB Ectopic Multiple Live Births   1              Obstetric Comments   1st Menstrual Cycle:  11 1st Pregnancy:  19       Home Medications    Prior to Admission medications   Medication Sig Start Date End Date Taking? Authorizing Provider  carbamazepine (CARBATROL) 300 MG 12 hr capsule Take 600 mg by mouth 2 (two) times daily. 06/03/15  Yes Historical Provider, MD  levETIRAcetam (KEPPRA) 500 MG tablet Take 750-1,000 mg by mouth See admin instructions. Takes 2 tabs in am and 1.5 tabs in pm 06/03/15  Yes Historical Provider, MD  Vitamin D, Ergocalciferol, (DRISDOL) 50000 units CAPS capsule Take 50,000 Units by mouth every Monday. 06/08/15  Yes Historical Provider, MD  docusate sodium (COLACE) 100 MG capsule Take 1 capsule (100 mg total) by mouth every 12 (twelve) hours. 08/10/16   Waynetta Pean, PA-C  Ferrous Fumarate (HEMOCYTE - 106 MG FE) 324 (106 Fe) MG TABS tablet Take 1 tablet (106 mg of iron total) by mouth 2 (two) times daily. Patient not taking: Reported on 08/10/2016 05/24/16   Gae Dry, MD  ferrous sulfate 325 (65 FE) MG tablet Take 1 tablet (325 mg total) by mouth daily. 08/10/16   Waynetta Pean, PA-C  Family History History reviewed. No pertinent family history.  Social History Social History  Substance Use Topics  . Smoking status: Never Smoker  . Smokeless tobacco: Never Used  . Alcohol use No     Allergies   Patient has no known allergies.   Review of Systems Review of Systems  Constitutional: Negative for chills and fever.  HENT: Negative for congestion and sore throat.   Eyes: Positive for visual disturbance. Negative for photophobia, pain and redness.  Respiratory: Negative for cough, shortness of breath and wheezing.   Cardiovascular: Negative for chest pain and palpitations.  Gastrointestinal: Negative for abdominal pain, diarrhea, nausea and vomiting.    Genitourinary: Negative for dysuria.  Musculoskeletal: Negative for back pain and neck pain.  Skin: Negative for rash.  Neurological: Positive for seizures, light-headedness and headaches. Negative for dizziness, syncope, speech difficulty, weakness and numbness.     Physical Exam Updated Vital Signs BP 119/82   Pulse 89   Temp 99.2 F (37.3 C) (Oral)   Resp (!) 22   Ht 5\' 5"  (1.651 m)   Wt 74.8 kg   LMP 07/27/2016   SpO2 100%   BMI 27.46 kg/m   Physical Exam  Constitutional: She is oriented to person, place, and time. She appears well-developed and well-nourished. No distress.  Nontoxic appearing.  HENT:  Head: Normocephalic and atraumatic.  Right Ear: External ear normal.  Left Ear: External ear normal.  Mouth/Throat: Oropharynx is clear and moist.  Mild clear middle ear effusion noted bilaterally without TM erythema or loss of landmarks. No temporal edema or tenderness.   Eyes: Conjunctivae and EOM are normal. Pupils are equal, round, and reactive to light. Right eye exhibits no discharge. Left eye exhibits no discharge.  Vision is grossly intact. EOMs intact.   Neck: Normal range of motion. Neck supple. No JVD present. No tracheal deviation present.  Cardiovascular: Normal rate, regular rhythm, normal heart sounds and intact distal pulses.  Exam reveals no gallop and no friction rub.   No murmur heard. Pulmonary/Chest: Effort normal and breath sounds normal. No stridor. No respiratory distress. She has no wheezes. She has no rales.  Abdominal: Soft. There is no tenderness. There is no guarding.  Musculoskeletal: Normal range of motion. She exhibits no edema or tenderness.  Lymphadenopathy:    She has no cervical adenopathy.  Neurological: She is alert and oriented to person, place, and time. No cranial nerve deficit or sensory deficit. She exhibits normal muscle tone. Coordination normal.  She is alert and oriented 3. Cranial nerves are intact. Speech is clear and  coherent. EOMs are intact. Vision is grossly intact. No pronator drift. Finger-to-nose intact bilaterally. Sensation is intact in her bilateral upper and lower extremities. Normal gait.   Skin: Skin is warm and dry. Capillary refill takes less than 2 seconds. No rash noted. She is not diaphoretic. No erythema. No pallor.  Psychiatric: She has a normal mood and affect. Her behavior is normal.  Nursing note and vitals reviewed.    ED Treatments / Results  Labs (all labs ordered are listed, but only abnormal results are displayed) Labs Reviewed  CBC WITH DIFFERENTIAL/PLATELET - Abnormal; Notable for the following:       Result Value   RBC 3.57 (*)    Hemoglobin 6.0 (*)    HCT 23.1 (*)    MCV 64.7 (*)    MCH 16.8 (*)    MCHC 26.0 (*)    RDW 19.4 (*)    All other  components within normal limits  BASIC METABOLIC PANEL  I-STAT BETA HCG BLOOD, ED ([facility], [facility], [facility] ONLY)  TYPE AND SCREEN    EKG  EKG Interpretation None       Radiology Ct Head Wo Contrast  Result Date: 08/10/2016 CLINICAL DATA:  Two seizures today, feeling like balloon swelling in her head since the seizures, history of seizure disorder and prior tumor resection EXAM: CT HEAD WITHOUT CONTRAST TECHNIQUE: Contiguous axial images were obtained from the base of the skull through the vertex without intravenous contrast. Sagittal and coronal MPR images reconstructed from axial data set. COMPARISON:  11/01/2009 FINDINGS: Brain: Normal ventricular morphology. No midline shift or mass effect. 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 No definite mass lesion or tumor evident by noncontrast CT. No intracranial hemorrhage, evidence of acute infarction, or extra-axial fluid collection. Posterior fossa unremarkable. Vascular: Unremarkable Skull: Other than postsurgical changes of prior RIGHT temporal craniotomy, no significant osseous findings. Sinuses/Orbits:  Clear Other: N/A IMPRESSION: Stable postsurgical changes of prior RIGHT temporal craniotomy and RIGHT temporal lobe resection. No acute intracranial abnormalities. Electronically Signed   By: Lavonia Dana M.D.   On: 08/10/2016 16:45    Procedures Procedures (including critical care time)  Medications Ordered in ED Medications  sodium chloride 0.9 % bolus 1,000 mL (1,000 mLs Intravenous New Bag/Given 08/10/16 1656)  metoCLOPramide (REGLAN) injection 10 mg (10 mg Intravenous Given 08/10/16 1656)  diphenhydrAMINE (BENADRYL) injection 25 mg (25 mg Intravenous Given 08/10/16 1656)     Initial Impression / Assessment and Plan / ED Course  I have reviewed the triage vital signs and the nursing notes.  Pertinent labs & imaging results that were available during my care of the patient were reviewed by me and considered in my medical decision making (see chart for details).    This is a 41 y.o. Female with a history of seizures and brain tumor who presents to the ED after two seizures prior to arrival. Patient reports she has a long history of seizures and prior to having a brain tumor removed she would have up to 5 seizures a day. She reports having a brain tumor removed approximately 8 years ago and her seizures greatly reduced. She is on Keppra for seizures and is followed by a neurologist Dr. Ahrianna Kendall at [facility]. She reports today she had 2 grand mal seizures. She typically only has a seizure every other week. She tells me these seizures were different today because she felt pressure in her head, and double vision. She reports the double vision has resolved, but she still reports a fontal headache. She denies falling or any injury from the seizures. She denies any changes to her medications. She takes keppra for her seizures. She reports feeling lightheaded with position change today. She denies feeling short of breath or bleeding.  On exam the patient is afebrile nontoxic appearing. She has no focal  neurological deficits. Her vision is grossly intact. We'll obtain blood work, CT of her head and migraine cocktail. Blood work here is remarkable for a hemoglobin of 6.0. Patient was most recently in the hospital after heavy vaginal bleeding and had a hemoglobin of 4.8. After transfusion she was discharged with a hemoglobin of 6.8. Patient does report she has a history of anemia. She is unsure what her baseline is. Blood work from 1 year ago shows a hemoglobin of 7 on care everywhere. She does report some lightheadedness  with position change, however this resolved after half liter fluid bolus, and migraine cocktail. She denies any shortness of breath, vaginal bleeding or hematochezia. She denies any symptoms of anemia. I suspect this hemoglobin of 6.0 is not far off of her baseline. CT of her head without contrast shows stable changes. No acute intracranial pathology noted. After migraine cocktail patient reports her symptoms have completely resolved. She ambulates without feeling lightheaded or dizzy. She reports her headache has completely resolved and she feels back at her baseline. She's had no seizures on the emergency department. Patient appears to be safe for discharge at this time. I discussed the findings with the patient and she agrees with plan. We'll have her follow-up next week with her primary care doctor to have her hemoglobin rechecked. I discussed strict and specific return precautions and discussed the signs and symptoms of anemia that would warrant her to return to the emergency department immediately. I also encouraged her to follow-up with her neurologist next week. I advised the patient to follow-up with their primary care provider this week. I advised the patient to return to the emergency department with new or worsening symptoms or new concerns. The patient verbalized understanding and agreement with plan.    This patient was discussed with Dr. Tamera Punt who agrees with assessment and  plan.   Final Clinical Impressions(s) / ED Diagnoses   Final diagnoses:  Seizure ([place])  Bad headache  Anemia due to other cause, not classified    New Prescriptions New Prescriptions   DOCUSATE SODIUM (COLACE) 100 MG CAPSULE    Take 1 capsule (100 mg total) by mouth every 12 (twelve) hours.   FERROUS SULFATE 325 (65 FE) MG TABLET    Take 1 tablet (325 mg total) by mouth daily.     Waynetta Pean, PA-C 08/10/16 [place], MD 08/10/16 2223

## 2016-08-10 NOTE — Discharge Instructions (Signed)
Please have your hemoglobin rechecked next week by your primary care provider. Today it was 6.0. We suspect this is around your baseline. If you have any symptoms of anemia, such as passing out, feeling short of breath or lightheaded please return to the ER immediately.

## 2016-08-12 DIAGNOSIS — J029 Acute pharyngitis, unspecified: Secondary | ICD-10-CM | POA: Diagnosis not present

## 2016-08-12 DIAGNOSIS — J069 Acute upper respiratory infection, unspecified: Secondary | ICD-10-CM | POA: Diagnosis not present

## 2017-01-08 DIAGNOSIS — Z9889 Other specified postprocedural states: Secondary | ICD-10-CM | POA: Diagnosis not present

## 2017-01-08 DIAGNOSIS — Z79899 Other long term (current) drug therapy: Secondary | ICD-10-CM | POA: Diagnosis not present

## 2017-01-08 DIAGNOSIS — C719 Malignant neoplasm of brain, unspecified: Secondary | ICD-10-CM | POA: Diagnosis not present

## 2017-01-08 DIAGNOSIS — R51 Headache: Secondary | ICD-10-CM | POA: Diagnosis not present

## 2017-07-13 ENCOUNTER — Encounter (HOSPITAL_COMMUNITY): Payer: Self-pay | Admitting: Emergency Medicine

## 2017-07-13 ENCOUNTER — Emergency Department (HOSPITAL_COMMUNITY)
Admission: EM | Admit: 2017-07-13 | Discharge: 2017-07-13 | Disposition: A | Discharge status: Home / Self Care | Payer: Medicare Other | Attending: Emergency Medicine | Admitting: Emergency Medicine

## 2017-07-13 DIAGNOSIS — Z79899 Other long term (current) drug therapy: Secondary | ICD-10-CM | POA: Insufficient documentation

## 2017-07-13 DIAGNOSIS — J029 Acute pharyngitis, unspecified: Secondary | ICD-10-CM | POA: Diagnosis not present

## 2017-07-13 DIAGNOSIS — J09X2 Influenza due to identified novel influenza A virus with other respiratory manifestations: Secondary | ICD-10-CM | POA: Diagnosis not present

## 2017-07-13 DIAGNOSIS — R05 Cough: Secondary | ICD-10-CM | POA: Diagnosis not present

## 2017-07-13 DIAGNOSIS — J101 Influenza due to other identified influenza virus with other respiratory manifestations: Secondary | ICD-10-CM

## 2017-07-13 DIAGNOSIS — M791 Myalgia, unspecified site: Secondary | ICD-10-CM | POA: Diagnosis not present

## 2017-07-13 LAB — INFLUENZA PANEL BY PCR (TYPE A & B)
INFLAPCR: POSITIVE — AB
Influenza B By PCR: NEGATIVE

## 2017-07-13 MED ORDER — ACETAMINOPHEN-CODEINE 120-12 MG/5ML PO SOLN: 10.0000 mL | ORAL | 0 refills | Status: DC | PRN

## 2017-07-13 MED ORDER — GUAIFENESIN ER 1200 MG PO TB12: 1.0000 | ORAL_TABLET | Freq: Two times a day (BID) | ORAL | 0 refills | Status: DC

## 2017-07-13 MED ORDER — IBUPROFEN 800 MG PO TABS: 800.0000 mg | ORAL_TABLET | Freq: Three times a day (TID) | ORAL | 0 refills | Status: DC | PRN

## 2017-07-13 NOTE — ED Provider Notes (Signed)
[place] EMERGENCY DEPARTMENT Provider Note   CSN: [id number] Arrival date & time: 07/13/17  1553     History   Chief Complaint Chief Complaint  Patient presents with  . Chills  . Sore Throat    HPI Sheila Moore is a 42 y.o. female.  HPI Patient presents to the emergency department with cough, sore throat, body aches, chills over the last 2 days.  The patient states that she has been taking over-the-counter medications without significant relief of her symptoms.  Patient states that she has had a nonproductive cough that is worse at nighttime.  Patient states that nothing seems to make the condition better or worse.  The patient denies chest pain, shortness of breath, headache,blurred vision, neck pain,weakness, numbness, dizziness, anorexia, edema, abdominal pain, nausea, vomiting, diarrhea, rash, back pain, dysuria, hematemesis,  near syncope, or syncope. Past Medical History:  Diagnosis Date  . Blood transfusion without reported diagnosis   . Seizures [facility])     Patient Active Problem List   Diagnosis Date Noted  . Menorrhagia 05/23/2016  . Anemia 05/23/2016  . Nipple discharge 11/06/2012    Past Surgical History:  Procedure Laterality Date  . BRAIN SURGERY  2010   tumor removed    OB History    Gravida Para Term Preterm AB Living   2 1     1 1    SAB TAB Ectopic Multiple Live Births   1              Obstetric Comments   1st Menstrual Cycle:  11 1st Pregnancy:  19       Home Medications    Prior to Admission medications   Medication Sig Start Date End Date Taking? Authorizing Provider  carbamazepine (CARBATROL) 300 MG 12 hr capsule Take 600 mg by mouth 2 (two) times daily. 06/03/15   [provider]  docusate sodium (COLACE) 100 MG capsule Take 1 capsule (100 mg total) by mouth every 12 (twelve) hours. 08/10/16   Waynetta Pean, PA-C  Ferrous Fumarate (HEMOCYTE - 106 MG FE) 324 (106 Fe) MG TABS tablet Take 1 tablet (106 mg of  iron total) by mouth 2 (two) times daily. Patient not taking: Reported on 08/10/2016 05/24/16   Gae Dry, MD  ferrous sulfate 325 (65 FE) MG tablet Take 1 tablet (325 mg total) by mouth daily. 08/10/16   Waynetta Pean, PA-C  levETIRAcetam (KEPPRA) 500 MG tablet Take 750-1,000 mg by mouth See admin instructions. Takes 2 tabs in am and 1.5 tabs in pm 06/03/15   [provider]  Vitamin D, Ergocalciferol, (DRISDOL) 50000 units CAPS capsule Take 50,000 Units by mouth every Monday. 06/08/15   [provider]    Family History History reviewed. No pertinent family history.  Social History Social History   Tobacco Use  . Smoking status: Never Smoker  . Smokeless tobacco: Never Used  Substance Use Topics  . Alcohol use: No  . Drug use: No     Allergies   Patient has no known allergies.   Review of Systems Review of Systems  All other systems negative except as documented in the HPI. All pertinent positives and negatives as reviewed in the HPI. Physical Exam Updated Vital Signs BP (!) 158/91   Pulse 91   Temp 99.3 F (37.4 C) (Oral)   Resp 16   Ht 5\' 5"  (1.651 m)   Wt 74.8 kg (165 lb)   LMP 06/12/2017   SpO2 100%  BMI 27.46 kg/m   Physical Exam  Constitutional: She is oriented to person, place, and time. She appears well-developed and well-nourished. No distress.  HENT:  Head: Normocephalic and atraumatic.  Right Ear: Tympanic membrane normal.  Left Ear: Tympanic membrane normal.  Mouth/Throat: Uvula is midline. Oropharyngeal exudate present.  Eyes: Pupils are equal, round, and reactive to light.  Neck: Normal range of motion. Neck supple.  Cardiovascular: Normal rate, regular rhythm and normal heart sounds. Exam reveals no gallop and no friction rub.  No murmur heard. Pulmonary/Chest: Effort normal and breath sounds normal. No respiratory distress. She has no wheezes.  Neurological: She is alert and oriented to person, place, and time. She  exhibits normal muscle tone. Coordination normal.  Skin: Skin is warm and dry. Capillary refill takes less than 2 seconds. No rash noted. No erythema.  Psychiatric: She has a normal mood and affect. Her behavior is normal.  Nursing note and vitals reviewed.    ED Treatments / Results  Labs (all labs ordered are listed, but only abnormal results are displayed) Labs Reviewed  INFLUENZA PANEL BY PCR (TYPE A & B) - Abnormal; Notable for the following components:      Result Value   Influenza A By PCR POSITIVE (*)    All other components within normal limits    EKG  EKG Interpretation None       Radiology No results found.  Procedures Procedures (including critical care time)  Medications Ordered in ED Medications - No data to display   Initial Impression / Assessment and Plan / ED Course  I have reviewed the triage vital signs and the nursing notes.  Pertinent labs & imaging results that were available during my care of the patient were reviewed by me and considered in my medical decision making (see chart for details).     Patient's influenza panel was positive for influenza A.  Patient be treated for this.  Have advised her to rest as much as possible and drink fluids.  Patient is advised to follow-up with her primary doctor as needed.  I told her to return to the emergency department for any worsening in her condition.  Patient agrees to the plan and all questions were answered.  Final Clinical Impressions(s) / ED Diagnoses   Final diagnoses:  None    ED Discharge Orders    None       Dalia Heading, Hershal Coria 07/13/17 1820    Mesner, Corene Cornea, MD 07/14/17 [phone]

## 2017-07-13 NOTE — ED Triage Notes (Signed)
Pt presents with cold chills, sore throat, subjective fever x 2 days

## 2017-07-13 NOTE — Discharge Instructions (Signed)
Return here for any worsening in your condition.  Follow-up with your primary doctor as needed.  Increase your fluid intake and rest as much as possible.  He can take he can take 650 mg of Tylenol every 4 hours along with the Motrin prescribed for fever and body aches.

## 2017-07-15 ENCOUNTER — Other Ambulatory Visit: Payer: Self-pay

## 2017-09-17 ENCOUNTER — Ambulatory Visit (INDEPENDENT_AMBULATORY_CARE_PROVIDER_SITE_OTHER): Payer: Medicare Other | Admitting: Obstetrics and Gynecology

## 2017-09-17 ENCOUNTER — Encounter: Payer: Self-pay | Admitting: Obstetrics and Gynecology

## 2017-09-17 VITALS — BP 120/80 | Ht 65.0 in | Wt 164.0 lb

## 2017-09-17 DIAGNOSIS — N760 Acute vaginitis: Secondary | ICD-10-CM

## 2017-09-17 DIAGNOSIS — R1032 Left lower quadrant pain: Secondary | ICD-10-CM | POA: Diagnosis not present

## 2017-09-17 DIAGNOSIS — R935 Abnormal findings on diagnostic imaging of other abdominal regions, including retroperitoneum: Secondary | ICD-10-CM

## 2017-09-17 DIAGNOSIS — B9689 Other specified bacterial agents as the cause of diseases classified elsewhere: Secondary | ICD-10-CM | POA: Diagnosis not present

## 2017-09-17 LAB — POCT URINALYSIS DIPSTICK
Bilirubin, UA: NEGATIVE
GLUCOSE UA: NEGATIVE
Ketones, UA: NEGATIVE
NITRITE UA: NEGATIVE
PH UA: 6 (ref 5.0–8.0)
RBC UA: NEGATIVE
SPEC GRAV UA: 1.02 (ref 1.010–1.025)

## 2017-09-17 LAB — POCT WET PREP WITH KOH
CLUE CELLS WET PREP PER HPF POC: POSITIVE
KOH PREP POC: POSITIVE — AB
Trichomonas, UA: NEGATIVE
Yeast Wet Prep HPF POC: NEGATIVE

## 2017-09-17 MED ORDER — METRONIDAZOLE 0.75 % VA GEL: 1.0000 | Freq: Every day | VAGINAL | 0 refills | Status: AC

## 2017-09-17 NOTE — Patient Instructions (Signed)
I value your feedback and entrusting us with your care. If you get a  patient survey, I would appreciate you taking the time to let us know about your experience today. Thank you! 

## 2017-09-17 NOTE — Progress Notes (Signed)
Sheila Carbon, MD   Chief Complaint  Patient presents with  . Pelvic Pain    left side  . Vaginal Discharge    clear with odor  . abnormal period    HPI:      [name] is a 42 y.o. G2P0011 who LMP was No LMP recorded., presents today for NP eval of LLQ pain for the past several months with sx intensity and frequency increase for the past few wks. Pt states pain is daily, achy, intermittent, and random. Can last 5-60 min. No aggrav/allev factors. Takes Motrin without relief. No urin sx except occas urgency with decreased flow. Has noticed increased d/c with fishy odor for the past few wks, no irritation. No recent abx use. No hx of BV. No GI sx, LBP, fevers.   She has not been sex active in over a yr.   Menses are usually monthly, last 5-6 days with 3 heavy days. Had bleeding for 30 days 1/18 and was found to be anemic, requiring blood transfusion. Was seen by Dr. Kenton Moore in ED and u/s showed 4.4 x 4.1 cm RTO cyst and EM=9.9 mm with focal abnormality. Pt given provera, bleeding stopped, and pt never did f/u because was doing fine.    Past Medical History:  Diagnosis Date  . Blood transfusion without reported diagnosis   . Seizures ([place])     Past Surgical History:  Procedure Laterality Date  . BRAIN SURGERY  2010   tumor removed    Family History  Problem Relation Age of Onset  . Hypertension Mother   . COPD Mother     Social History   Socioeconomic History  . Marital status: Legally Separated    Spouse name: Not on file  . Number of children: Not on file  . Years of education: Not on file  . Highest education level: Not on file  Occupational History  . Not on file  Social Needs  . Financial resource strain: Not on file  . Food insecurity:    Worry: Not on file    Inability: Not on file  . Transportation needs:    Medical: Not on file    Non-medical: Not on file  Tobacco Use  . Smoking status: Never Smoker  . Smokeless tobacco: Never Used    Substance and Sexual Activity  . Alcohol use: No  . Drug use: No  . Sexual activity: Not Currently    Birth control/protection: None  Lifestyle  . Physical activity:    Days per week: Not on file    Minutes per session: Not on file  . Stress: Not on file  Relationships  . Social connections:    Talks on phone: Not on file    Gets together: Not on file    Attends religious service: Not on file    Active member of club or organization: Not on file    Attends meetings of clubs or organizations: Not on file    Relationship status: Not on file  . Intimate partner violence:    Fear of current or ex partner: Not on file    Emotionally abused: Not on file    Physically abused: Not on file    Forced sexual activity: Not on file  Other Topics Concern  . Not on file  Social History Narrative  . Not on file    Outpatient Medications Prior to Visit  Medication Sig Dispense Refill  . carbamazepine (CARBATROL) 300 MG 12  hr capsule Take 600 mg by mouth 2 (two) times daily.    Marland Kitchen levETIRAcetam (KEPPRA) 500 MG tablet Take 750-1,000 mg by mouth See admin instructions. Takes 2 tabs in am and 1.5 tabs in pm    . acetaminophen-codeine 120-12 MG/5ML solution Take 10 mLs by mouth every 4 (four) hours as needed for moderate pain. (Patient not taking: Reported on 09/17/2017) 240 mL 0  . docusate sodium (COLACE) 100 MG capsule Take 1 capsule (100 mg total) by mouth every 12 (twelve) hours. (Patient not taking: Reported on 09/17/2017) 30 capsule 0  . Ferrous Fumarate (HEMOCYTE - 106 MG FE) 324 (106 Fe) MG TABS tablet Take 1 tablet (106 mg of iron total) by mouth 2 (two) times daily. (Patient not taking: Reported on 08/10/2016) 30 tablet 2  . ferrous sulfate 325 (65 FE) MG tablet Take 1 tablet (325 mg total) by mouth daily. (Patient not taking: Reported on 09/17/2017) 30 tablet 0  . Guaifenesin 1200 MG TB12 Take 1 tablet (1,200 mg total) by mouth 2 (two) times daily. (Patient not taking: Reported on 09/17/2017)  20 each 0  . ibuprofen (ADVIL,MOTRIN) 800 MG tablet Take 1 tablet (800 mg total) by mouth every 8 (eight) hours as needed. (Patient not taking: Reported on 09/17/2017) 21 tablet 0  . Vitamin D, Ergocalciferol, (DRISDOL) 50000 units CAPS capsule Take 50,000 Units by mouth every Monday.     No facility-administered medications prior to visit.       ROS:  Review of Systems  Constitutional: Negative for fatigue, fever and unexpected weight change.  Respiratory: Negative for cough, shortness of breath and wheezing.   Cardiovascular: Negative for chest pain, palpitations and leg swelling.  Gastrointestinal: Negative for blood in stool, constipation, diarrhea, nausea and vomiting.  Endocrine: Negative for cold intolerance, heat intolerance and polyuria.  Genitourinary: Positive for pelvic pain and vaginal discharge. Negative for dyspareunia, dysuria, flank pain, frequency, genital sores, hematuria, menstrual problem, urgency, vaginal bleeding and vaginal pain.  Musculoskeletal: Negative for back pain, joint swelling and myalgias.  Skin: Negative for rash.  Neurological: Positive for headaches. Negative for dizziness, syncope, light-headedness and numbness.  Hematological: Negative for adenopathy.  Psychiatric/Behavioral: Negative for agitation, confusion, sleep disturbance and suicidal ideas. The patient is not nervous/anxious.    BREAST: No symptoms   OBJECTIVE:   Vitals:  BP 120/80   Ht 5\' 5"  (3.710 m)   Wt 164 lb (74.4 kg)   BMI 27.29 kg/m   Physical Exam  Constitutional: She is oriented to person, place, and time. Vital signs are normal. She appears well-developed.  Pulmonary/Chest: Effort normal.  Abdominal: Soft. Normal appearance. There is no tenderness. There is no rigidity and no guarding.  Genitourinary: Uterus normal. There is no rash, tenderness or lesion on the right labia. There is no rash, tenderness or lesion on the left labia. Uterus is not enlarged and not tender.  Cervix exhibits no motion tenderness. Right adnexum displays no mass and no tenderness. Left adnexum displays no mass and no tenderness. No erythema or tenderness in the vagina. Vaginal discharge found.  Musculoskeletal: Normal range of motion.  Neurological: She is alert and oriented to person, place, and time.  Psychiatric: She has a normal mood and affect. Her behavior is normal. Thought content normal.  Vitals reviewed.   Results: Results for orders placed or performed in visit on 09/17/17 (from the past 24 hour(s))  POCT Wet Prep with KOH     Status: Abnormal   Collection Time: 09/17/17 11:43  AM  Result Value Ref Range   Trichomonas, UA Negative    Clue Cells Wet Prep HPF POC pos    Epithelial Wet Prep HPF POC  Few, Moderate, Many, Too numerous to count   Yeast Wet Prep HPF POC neg    Bacteria Wet Prep HPF POC  Few   RBC Wet Prep HPF POC     WBC Wet Prep HPF POC     KOH Prep POC Positive (A) Negative  POCT Urinalysis Dipstick     Status: Abnormal   Collection Time: 09/17/17 11:43 AM  Result Value Ref Range   Color, UA straw    Clarity, UA clear    Glucose, UA neg    Bilirubin, UA neg    Ketones, UA neg    Spec Grav, UA 1.020 1.010 - 1.025   Blood, UA neg    pH, UA 6.0 5.0 - 8.0   Protein, UA trace    Urobilinogen, UA  0.2 or 1.0 E.U./dL   Nitrite, UA neg    Leukocytes, UA Trace (A) Negative   Appearance     Odor      Assessment/Plan: LLQ pain - Neg dip, exam. Check GYN u/s. Will call wiht results.  - Plan: POCT Urinalysis Dipstick, US PELVIS TRANSVANGINAL NON-OB (TV ONLY)  Bacterial vaginosis - Pos wet prep. Rx metrogel. Will RF if sx recur. F/u prn.  - Plan: POCT Wet Prep with KOH, metroNIDAZOLE (METROGEL) 0.75 % vaginal gel  Abnormal endometrial ultrasound - 1/18. Rechk GYN u/s. F/u based on results. No longer having DUB sx.   Meds ordered this encounter  Medications  . metroNIDAZOLE (METROGEL) 0.75 % vaginal gel    Sig: Place 1 Applicatorful vaginally at  bedtime for 5 days.    Dispense:  50 g    Refill:  0    Order Specific Question:   Supervising Provider    Answer:   Gae Dry [[id number]]      Return in about 1 day (around 09/18/2017) for GYN u/s for LLQ pain--ABC to call pt.  Alicia B. Copland, PA-C 09/17/2017 11:45 AM

## 2017-09-19 ENCOUNTER — Other Ambulatory Visit: Payer: Medicare Other

## 2017-09-25 ENCOUNTER — Ambulatory Visit (INDEPENDENT_AMBULATORY_CARE_PROVIDER_SITE_OTHER): Payer: Medicare Other

## 2017-09-25 ENCOUNTER — Telehealth: Payer: Self-pay | Admitting: Obstetrics and Gynecology

## 2017-09-25 DIAGNOSIS — R1032 Left lower quadrant pain: Secondary | ICD-10-CM

## 2017-09-25 NOTE — Telephone Encounter (Signed)
   ULTRASOUND REPORT  Patient Name: Sheila Moore DOB: [birth year] MRN: [id number]  Location: [facility]  Date of Service: 09/25/2017    Indications:Pelvic Pain Findings:  The uterus is retroflexed and measures 13.35 x 5.20 x 6.90. Echo texture is heterogenous without evidence of focal masses.  The Endometrium measures 3.52 mm.  Right Ovary measures 4.49 x 3.32 x 3.62 cm. It is normal in appearance with a dominant follicle measuring 5.94 x 1.86cm Left Ovary measures 3.79 x 2.71 x 2.73 cm. It is normal in appearance. Survey of the adnexa demonstrates no adnexal masses. There is no free fluid in the cul de sac.  Impression: 1. Heterogeneous uterus, otherwise normal  Recommendations: 1.Clinical correlation with the patient's History and Physical Exam.   Edwena Bunde, RDMS, RVT

## 2017-09-30 NOTE — Telephone Encounter (Signed)
Pt aware of neg GYN u/s. LLQ pain improved. BV sx resolved. Pt feeling better. F/u prn.

## 2017-11-11 ENCOUNTER — Telehealth: Payer: Self-pay

## 2017-11-11 ENCOUNTER — Other Ambulatory Visit: Payer: Self-pay | Admitting: Obstetrics and Gynecology

## 2017-11-11 MED ORDER — FLUCONAZOLE 150 MG PO TABS: 150.0000 mg | ORAL_TABLET | Freq: Once | ORAL | 0 refills | Status: AC

## 2017-11-11 NOTE — Telephone Encounter (Signed)
Pt calling to report that the pharmacy states the diflucan that was sent in will have a reaction with her carbamazeprine that she takes. ABC aware and has decided to tell pt to use OTC medication. I explained The name and types of yeast medications. Pt was unhappy and ended the call.

## 2017-11-11 NOTE — Telephone Encounter (Signed)
What sx is pt having? Last seen 4/19. RN to clarify.

## 2017-11-11 NOTE — Telephone Encounter (Signed)
Pt states the condition she was seen for last visit is worse.  Would like to speak c ABC.  [phone]

## 2017-11-11 NOTE — Telephone Encounter (Signed)
Pt reports having BV in the past, now she's having itching, thick discharge like cottage cheese, do you want pt to make appt?

## 2017-11-11 NOTE — Progress Notes (Signed)
Rx diflucan for yeast vag sx after BV tx

## 2017-11-11 NOTE — Telephone Encounter (Signed)
Rx diflucan eRxd for yeast vag sx after BV abx tx. F/u if sx persist. Rn to notify pt.

## 2017-11-11 NOTE — Telephone Encounter (Signed)
Pt aware.

## 2017-11-12 ENCOUNTER — Telehealth: Payer: Self-pay

## 2017-11-12 NOTE — Telephone Encounter (Signed)
Sheila Moore has a drug interaction question prior to filing the medication. [phone]

## 2017-11-13 ENCOUNTER — Telehealth: Payer: Self-pay

## 2017-11-13 NOTE — Telephone Encounter (Signed)
Called pt Pharmacy to check on drug interaction and saw the note Quintella Baton had made in regards to the same interaction and told the pharmacy to not fill the prescription

## 2018-05-04 ENCOUNTER — Other Ambulatory Visit: Payer: Self-pay

## 2018-05-04 ENCOUNTER — Emergency Department
Admission: EM | Admit: 2018-05-04 | Discharge: 2018-05-04 | Disposition: A | Discharge status: Home / Self Care | Payer: Medicare Other | Attending: Emergency Medicine | Admitting: Emergency Medicine

## 2018-05-04 ENCOUNTER — Encounter: Payer: Self-pay | Admitting: Emergency Medicine

## 2018-05-04 ENCOUNTER — Emergency Department: Payer: Medicare Other

## 2018-05-04 DIAGNOSIS — G9389 Other specified disorders of brain: Secondary | ICD-10-CM | POA: Diagnosis not present

## 2018-05-04 DIAGNOSIS — R51 Headache: Secondary | ICD-10-CM

## 2018-05-04 DIAGNOSIS — Z79899 Other long term (current) drug therapy: Secondary | ICD-10-CM | POA: Insufficient documentation

## 2018-05-04 DIAGNOSIS — D496 Neoplasm of unspecified behavior of brain: Secondary | ICD-10-CM | POA: Diagnosis not present

## 2018-05-04 DIAGNOSIS — R519 Headache, unspecified: Secondary | ICD-10-CM

## 2018-05-04 HISTORY — DX: Benign neoplasm of brain, unspecified: D33.2

## 2018-05-04 MED ORDER — GADOBUTROL 1 MMOL/ML IV SOLN
7.0000 mL | Freq: Once | INTRAVENOUS | Status: AC | PRN
  Administered 2018-05-04: 7 mL via INTRAVENOUS

## 2018-05-04 NOTE — ED Triage Notes (Signed)
Pt to ED via POV pain in her head. Pt states that for the last 2 months she has been getting flashes of lights and then she gets a lot of pressure in her head and she gets nauseated and lightheaded. Pt states that this has happened 3 times over the last 2 months and each time it gets worse. Pt states after the pressure in her head goes away her head is sore for the rest of the day. Pt has hx/o brain tumor that was removed in 2010, pt states that she had similar symptoms when the tumor was diagnosed. Pt is in NAD at this time.

## 2018-05-04 NOTE — Discharge Instructions (Addendum)
Please return here for worse pain fever vomiting or feeling sicker.  If you are taking Motrin 800 mg before for this headache you can try 4 of the over-the-counter Motrin's that will total 800 mg.  Do not take it more than 3 times a day with food.  I would not take it for more than 2 or 3 days in a row.  If you only you are taking it once a day he can take it for much longer.  Please be sure to follow-up with [facility].  If you do not get an appointment scheduled by the middle of next week please call over there and see if Dr. Inocente Salles can get you an appointment as we discussed.  Please take the CD of the MRI that we did today with you when you go.

## 2018-05-04 NOTE — ED Provider Notes (Signed)
[facility] Emergency Department Provider Note   ____________________________________________   First MD Initiated Contact with Patient 05/04/18 1106     (approximate)  I have reviewed the triage vital signs and the nursing notes.   HISTORY  Chief Complaint Headache    HPI Sheila Moore is a 42 y.o. 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  She comes in complaining of headaches that started as pressure with black and white flashing lights around the peripheral visual fields and then increase in intensity progressed to nausea and vomiting.  These headaches are made worse with bright lights and loud noises.  They typically last most of the day once they come on and gradually resolved.  Patient reports the headaches are similar to what she had prior to being diagnosed with the brain tumor.  Patient follows up yearly now with [facility] neurology.  I called Dr. Inocente Salles at [facility] she will follow-up this patient in January as planned we will try to get an MRI here today.  Past Medical History:  Diagnosis Date  . Blood transfusion without reported diagnosis   . Brain tumor (benign) ([place])   . Seizures [facility])     Patient Active Problem List   Diagnosis Date Noted  . Menorrhagia 05/23/2016  . Anemia 05/23/2016  . Nipple discharge 11/06/2012    Past Surgical History:  Procedure Laterality Date  . BRAIN SURGERY  2010   tumor removed    Prior to Admission medications   Medication Sig Start Date End Date Taking? Authorizing Provider  acetaminophen-codeine 120-12 MG/5ML solution Take 10 mLs by mouth every 4 (four) hours as needed for moderate pain. Patient not taking: Reported on 09/17/2017 07/13/17   Dalia Heading, PA-C  carbamazepine (CARBATROL) 300 MG 12 hr capsule Take 600 mg by mouth 2 (two) times daily. 06/03/15   [provider]  docusate sodium (COLACE) 100 MG capsule Take 1 capsule (100 mg  total) by mouth every 12 (twelve) hours. Patient not taking: Reported on 09/17/2017 08/10/16   Waynetta Pean, PA-C  Ferrous Fumarate (HEMOCYTE - 106 MG FE) 324 (106 Fe) MG TABS tablet Take 1 tablet (106 mg of iron total) by mouth 2 (two) times daily. Patient not taking: Reported on 08/10/2016 05/24/16   Gae Dry, MD  ferrous sulfate 325 (65 FE) MG tablet Take 1 tablet (325 mg total) by mouth daily. Patient not taking: Reported on 09/17/2017 08/10/16   Waynetta Pean, PA-C  Guaifenesin 1200 MG TB12 Take 1 tablet (1,200 mg total) by mouth 2 (two) times daily. Patient not taking: Reported on 09/17/2017 07/13/17   Dalia Heading, PA-C  ibuprofen (ADVIL,MOTRIN) 800 MG tablet Take 1 tablet (800 mg total) by mouth every 8 (eight) hours as needed. Patient not taking: Reported on 09/17/2017 07/13/17   Dalia Heading, PA-C  levETIRAcetam (KEPPRA) 500 MG tablet Take 750-1,000 mg by mouth See admin instructions. Takes 2 tabs in am and 1.5 tabs in pm 06/03/15   [provider]  Vitamin D, Ergocalciferol, (DRISDOL) 50000 units CAPS capsule Take 50,000 Units by mouth every Monday. 06/08/15   [provider]    Allergies Patient has no known allergies.  Family History  Problem Relation Age of Onset  . Hypertension Mother   . COPD Mother     Social History Social History   Tobacco Use  . Smoking status: Never Smoker  . Smokeless tobacco: Never Used  Substance Use  Topics  . Alcohol use: No  . Drug use: No    Review of Systems  Constitutional: No fever/chills Eyes: No visual changes. ENT: No sore throat. Cardiovascular: Denies chest pain. Respiratory: Denies shortness of breath. Gastrointestinal: No abdominal pain.  No nausea, no vomiting.  No diarrhea.  No constipation. Genitourinary: Negative for dysuria. Musculoskeletal: Negative for back pain. Skin: Negative for rash. Neurological: Negative for focal weakness  **}  ____________________________________________   PHYSICAL EXAM:  VITAL SIGNS: ED Triage Vitals  Enc Vitals Group     BP 05/04/18 1036 (!) 146/93     Pulse Rate 05/04/18 1036 75     Resp 05/04/18 1036 16     Temp 05/04/18 1036 98.1 F (36.7 C)     Temp Source 05/04/18 1036 Oral     SpO2 05/04/18 1036 100 %     Weight 05/04/18 1036 165 lb (74.8 kg)     Height 05/04/18 1036 5\' 5"  (1.651 m)     Head Circumference --      Peak Flow --      Pain Score 05/04/18 1035 6     Pain Loc --      Pain Edu? --      Excl. in [place]? --     Constitutional: Alert and oriented. Well appearing and in no acute distress. Eyes: Conjunctivae are normal. PERRL. EOMI. fundi are normal to my exam Head: Atraumatic. Nose: No congestion/rhinnorhea. Mouth/Throat: Mucous membranes are moist.  Oropharynx non-erythematous. Neck: No stridor. Cardiovascular: Normal rate, regular rhythm. Grossly normal heart sounds.  Good peripheral circulation. Respiratory: Normal respiratory effort.  No retractions. Lungs CTAB. Gastrointestinal: Soft and nontender. No distention. No abdominal bruits.  Musculoskeletal: No lower extremity tenderness nor edema.  Neurologic:  Normal speech and language. No gross focal neurologic deficits are appreciated.  Cranial nerves II through XII are intact although visual fields were not checked cerebellar finger-to-nose and rapid alternating movements and hands are normal motor strength is 5/5 throughout patient does not report any numbness Skin:  Skin is warm, dry and intact. No rash noted. Psychiatric: Mood and affect are normal. Speech and behavior are normal.  ____________________________________________   LABS (all labs ordered are listed, but only abnormal results are displayed)  Labs Reviewed - No data to display ____________________________________________  EKG  ____________________________________________  RADIOLOGY  ED MD interpretation: MRI read by radiologist shows  recurrence of tumor likely with same type of tumor.  I have informed the patient's neurologist Dr. Inocente Salles.  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 Recurrent headaches for the past 2 months with flashes of light, nausea - symptoms similar to before tumor resection. EXAM: MRI HEAD WITHOUT AND WITH CONTRAST TECHNIQUE: Multiplanar, multiecho pulse sequences of the brain and surrounding structures were obtained without and with intravenous contrast. CONTRAST:  7 milliliters Gadavist COMPARISON:  Head CTs without contrast 08/10/2016 and earlier. FINDINGS: Brain: Sequelae of right frontotemporal craniotomy and right temporal lobe resection. Cystic resection cavity with mild surrounding T2 and FLAIR hyperintense gliosis. Trace hemosiderin along the posterior cavity. Superimposed indistinct T2 hyperintense, frondlike, FLAIR hyperintense and nodular enhancing soft tissue along the medial aspect of the cavity in the suprasellar cistern encompassing an area of 22 x 23 x 17 millimeters (AP by transverse by CC)  best demonstrated on FLAIR (series 13, images 23 and 24, and postcontrast series 16, image 80, series 17, image 17 and series 18, image 12. The abnormal soft tissue is isointense to mildly facilitated on diffusion. The abnormal enhancing material appears largely extra-axial and is insinuated about the right ICA terminus. The lesion is contiguous with the pituitary gland and infundibulum, which appear thickened. There is mild elevation of the right optic chiasm (series 15, image 18). No invasion of the chiasm or 2nd cranial nerves. Superimposed postoperative enhancement in the right middle cranial fossa. Asymmetry of the lateral ventricles with benign  appearing right choroid plexus cyst (nonenhancing). No other abnormal intracranial enhancement identified. No dural thickening. No restricted diffusion to suggest acute infarction. No midline shift, ventriculomegaly, or acute intracranial hemorrhage. Cervicomedullary junction and pituitary are within normal limits. Outside the right temporal lobe region gray and white matter signal is within normal limits. Vascular: Major intracranial vascular flow voids are preserved. The major dural venous sinuses are enhancing and appear patent. Skull and upper cervical spine: Negative visible cervical spine and spinal cord. Visualized bone marrow signal is within normal limits. Sinuses/Orbits: Negative orbits. Paranasal sinuses and mastoids are stable and well pneumatized. Other: Visible internal auditory structures appear normal. Scalp and face soft tissues appear negative. IMPRESSION: Positive for T2 hyperintense and nodular enhancing soft tissue measuring 2.3 cm which appears mostly extra-axial, in the suprasellar cistern along the medial margin of the resection cavity. The lesion is inseparable from the pituitary gland and infundibulum, somewhat insinuated about the right ICA terminus, and mildly elevates the right optic chiasm without evidence of chiasm invasion. In this clinical setting favor recurrent Pilocytic Astrocytoma. Differential considerations include ependymoma, central neurocytoma. Electronically Signed   By: Genevie Ann M.D.   On: 05/04/2018 13:42    ____________________________________________   PROCEDURES  Procedure(s) performed:   Procedures  Critical Care performed:   ____________________________________________   INITIAL IMPRESSION / ASSESSMENT AND PLAN / ED COURSE  Patient will return for any worsening headache or new symptoms continue her usual treatment for this of of Motrin or Tylenol.  She will follow-up with neurosurgery awake.       ____________________________________________   FINAL CLINICAL IMPRESSION(S) / ED DIAGNOSES  Final diagnoses:  Nonintractable headache, unspecified chronicity pattern, unspecified headache type  Recurrent brain tumor [facility])     ED Discharge Orders    None       Note:  This document was prepared using Dragon voice recognition software and may include unintentional dictation errors.    Nena Polio, MD 05/04/18 [phone]

## 2018-05-04 NOTE — ED Notes (Addendum)
Patient transported to MRI by this RN  

## 2018-05-06 DIAGNOSIS — C719 Malignant neoplasm of brain, unspecified: Secondary | ICD-10-CM | POA: Diagnosis not present

## 2018-06-19 DIAGNOSIS — Z79899 Other long term (current) drug therapy: Secondary | ICD-10-CM | POA: Diagnosis not present

## 2018-06-19 DIAGNOSIS — C719 Malignant neoplasm of brain, unspecified: Secondary | ICD-10-CM | POA: Diagnosis not present

## 2018-06-19 DIAGNOSIS — Z9889 Other specified postprocedural states: Secondary | ICD-10-CM | POA: Diagnosis not present

## 2018-06-23 DIAGNOSIS — C719 Malignant neoplasm of brain, unspecified: Secondary | ICD-10-CM | POA: Diagnosis not present

## 2018-06-23 DIAGNOSIS — D3102 Benign neoplasm of left conjunctiva: Secondary | ICD-10-CM | POA: Diagnosis not present

## 2018-06-23 DIAGNOSIS — H2513 Age-related nuclear cataract, bilateral: Secondary | ICD-10-CM | POA: Diagnosis not present

## 2018-08-14 DIAGNOSIS — Z9889 Other specified postprocedural states: Secondary | ICD-10-CM | POA: Diagnosis not present

## 2018-08-14 DIAGNOSIS — C712 Malignant neoplasm of temporal lobe: Secondary | ICD-10-CM | POA: Diagnosis not present

## 2018-08-14 DIAGNOSIS — C719 Malignant neoplasm of brain, unspecified: Secondary | ICD-10-CM | POA: Diagnosis not present

## 2019-02-19 DIAGNOSIS — C719 Malignant neoplasm of brain, unspecified: Secondary | ICD-10-CM | POA: Diagnosis not present

## 2019-02-19 DIAGNOSIS — Z9889 Other specified postprocedural states: Secondary | ICD-10-CM | POA: Diagnosis not present

## 2019-02-19 DIAGNOSIS — C712 Malignant neoplasm of temporal lobe: Secondary | ICD-10-CM | POA: Diagnosis not present

## 2019-03-14 ENCOUNTER — Encounter: Payer: Self-pay | Admitting: Emergency Medicine

## 2019-03-14 ENCOUNTER — Other Ambulatory Visit: Payer: Self-pay

## 2019-03-14 ENCOUNTER — Observation Stay
Admission: EM | Admit: 2019-03-14 | Discharge: 2019-03-15 | Disposition: A | Discharge status: Home / Self Care | Payer: Medicare Other | Attending: Obstetrics and Gynecology | Admitting: Obstetrics and Gynecology

## 2019-03-14 DIAGNOSIS — Z23 Encounter for immunization: Secondary | ICD-10-CM | POA: Insufficient documentation

## 2019-03-14 DIAGNOSIS — R569 Unspecified convulsions: Secondary | ICD-10-CM | POA: Insufficient documentation

## 2019-03-14 DIAGNOSIS — D62 Acute posthemorrhagic anemia: Secondary | ICD-10-CM | POA: Diagnosis not present

## 2019-03-14 DIAGNOSIS — Z20828 Contact with and (suspected) exposure to other viral communicable diseases: Secondary | ICD-10-CM | POA: Insufficient documentation

## 2019-03-14 DIAGNOSIS — Z79899 Other long term (current) drug therapy: Secondary | ICD-10-CM | POA: Insufficient documentation

## 2019-03-14 DIAGNOSIS — D500 Iron deficiency anemia secondary to blood loss (chronic): Secondary | ICD-10-CM | POA: Diagnosis not present

## 2019-03-14 DIAGNOSIS — N920 Excessive and frequent menstruation with regular cycle: Principal | ICD-10-CM | POA: Insufficient documentation

## 2019-03-14 DIAGNOSIS — D649 Anemia, unspecified: Secondary | ICD-10-CM | POA: Diagnosis not present

## 2019-03-14 DIAGNOSIS — N939 Abnormal uterine and vaginal bleeding, unspecified: Secondary | ICD-10-CM

## 2019-03-14 LAB — PREPARE RBC (CROSSMATCH)

## 2019-03-14 LAB — BASIC METABOLIC PANEL
Anion gap: 8 (ref 5–15)
BUN: 11 mg/dL (ref 6–20)
CO2: 25 mmol/L (ref 22–32)
Calcium: 9 mg/dL (ref 8.9–10.3)
Chloride: 106 mmol/L (ref 98–111)
Creatinine, Ser: 0.75 mg/dL (ref 0.44–1.00)
GFR calc Af Amer: >60 mL/min (ref >60)
GFR calc non Af Amer: >60 mL/min (ref >60)
Glucose, Bld: 113 mg/dL — ABNORMAL HIGH (ref 70–99)
Potassium: 3.8 mmol/L (ref 3.5–5.1)
Sodium: 139 mmol/L (ref 135–145)

## 2019-03-14 LAB — CBC WITH DIFFERENTIAL/PLATELET
Abs Immature Granulocytes: 0.02 10*3/uL (ref 0.00–0.07)
Basophils Absolute: 0 10*3/uL (ref 0.0–0.1)
Basophils Relative: 1 %
Eosinophils Absolute: 0.1 10*3/uL (ref 0.0–0.5)
Eosinophils Relative: 1 %
HCT: 16.5 % — ABNORMAL LOW (ref 36.0–46.0)
Hemoglobin: 4 g/dL — CL (ref 12.0–15.0)
Immature Granulocytes: 0 %
Lymphocytes Relative: 21 %
Lymphs Abs: 1.7 10*3/uL (ref 0.7–4.0)
MCH: 15.8 pg — ABNORMAL LOW (ref 26.0–34.0)
MCHC: 24.2 g/dL — ABNORMAL LOW (ref 30.0–36.0)
MCV: 65.2 fL — ABNORMAL LOW (ref 80.0–100.0)
Monocytes Absolute: 0.6 10*3/uL (ref 0.1–1.0)
Monocytes Relative: 8 %
Neutro Abs: 5.3 10*3/uL (ref 1.7–7.7)
Neutrophils Relative %: 69 %
Platelets: 201 10*3/uL (ref 150–400)
RBC: 2.53 MIL/uL — ABNORMAL LOW (ref 3.87–5.11)
RDW: 22.1 % — ABNORMAL HIGH (ref 11.5–15.5)
WBC: 7.7 10*3/uL (ref 4.0–10.5)
nRBC: 0.3 % — ABNORMAL HIGH (ref 0.0–0.2)

## 2019-03-14 LAB — SARS CORONAVIRUS 2 BY RT PCR (HOSPITAL ORDER, PERFORMED IN ~~LOC~~ HOSPITAL LAB): SARS Coronavirus 2: NEGATIVE

## 2019-03-14 LAB — TSH: TSH: 1.487 u[IU]/mL (ref 0.350–4.500)

## 2019-03-14 LAB — HCG, QUANTITATIVE, PREGNANCY: hCG, Beta Chain, Quant, S: <1 m[IU]/mL (ref <5)

## 2019-03-14 MED ORDER — SODIUM CHLORIDE 0.9 % IV SOLN: 10.0000 mL/h | Freq: Once | INTRAVENOUS | Status: DC

## 2019-03-14 MED ORDER — LEVETIRACETAM 500 MG PO TABS
1000.0000 mg | ORAL_TABLET | Freq: Every morning | ORAL | Status: DC
  Administered 2019-03-15: 1000 mg via ORAL
  Filled 2019-03-14: qty 2

## 2019-03-14 MED ORDER — CARBAMAZEPINE ER 100 MG PO TB12
600.0000 mg | ORAL_TABLET | Freq: Two times a day (BID) | ORAL | Status: DC
  Administered 2019-03-14 – 2019-03-15 (×2): 600 mg via ORAL
  Filled 2019-03-14 (×3): qty 6

## 2019-03-14 MED ORDER — PRENATAL MULTIVITAMIN CH
1.0000 | ORAL_TABLET | Freq: Every day | ORAL | Status: DC
  Filled 2019-03-14: qty 1

## 2019-03-14 MED ORDER — SODIUM CHLORIDE 0.9 % IV SOLN
INTRAVENOUS | Status: DC
  Administered 2019-03-15: 03:00:00 via INTRAVENOUS

## 2019-03-14 MED ORDER — MEDROXYPROGESTERONE ACETATE 10 MG PO TABS
20.0000 mg | ORAL_TABLET | Freq: Three times a day (TID) | ORAL | Status: DC
  Administered 2019-03-15 (×2): 20 mg via ORAL
  Filled 2019-03-14 (×2): qty 2

## 2019-03-14 MED ORDER — LEVETIRACETAM 750 MG PO TABS: 750.0000 mg | ORAL_TABLET | ORAL | Status: DC

## 2019-03-14 MED ORDER — LEVETIRACETAM 750 MG PO TABS
750.0000 mg | ORAL_TABLET | Freq: Every evening | ORAL | Status: DC
  Administered 2019-03-14: 750 mg via ORAL
  Filled 2019-03-14 (×2): qty 1

## 2019-03-14 NOTE — H&P (Signed)
Obstetrics & Gynecology Consult H&P   Chief Complaint: Menorrhagia to anemia  History of Present Illness:   Patient is a 43 y.o. G2P1011 with a long standing history of menorrhagia.  She has a history of prior admission for blood transfusion in 2018.  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  She has headaches as well right temporal seizures.  Is on Keppra and carbamazepine for her seizures.   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    Her most recent bleeding episode started about 2-3 weeks ago, it increased in flow in the past 4 days, she reports going through a box of super tampons in the past 48-hrs and was doubling up with pads as well.  Since presenting to ER her bleeding has actually subsided.  She was noted to have a Hgb of 4.0 on presentation. She reports irregular menses at time heavy for the better part of a yeqar now, and as previously mentioned a prior admission in 2018 for blood transfusion.  She reports one episode of nipple discharge, described as black that was worked up an negative.  She also reports hot flashes which have become more pronounced in the past year.    Obstetric history is notable for one prior vaginal delivery, followed by C-section and tubal ligation 20 years ago.      Review of Systems:10 point review of systems  Past Medical History:  Past Medical History:  Diagnosis Date  . Blood transfusion without reported diagnosis   . Brain tumor (benign) ([place])   . Seizures ([place])     Past Surgical History:  Past Surgical History:  Procedure Laterality Date  . BRAIN SURGERY  2010   tumor removed    Obstetric History: G2P0011  Family History:  Family History  Problem Relation Age of Onset  . Hypertension Mother   . COPD Mother     Social History:  Social History   Socioeconomic  History  . Marital status: Legally Separated    Spouse name: Not on file  . Number of children: Not on file  . Years of education: Not on file  . Highest education level: Not on file  Occupational History  . Not on file  Social Needs  . Financial resource strain: Not on file  . Food insecurity    Worry: Not on file    Inability: Not on file  . Transportation needs    Medical: Not on file    Non-medical: Not on file  Tobacco Use  . Smoking status: Never Smoker  . Smokeless tobacco: Never Used  Substance and Sexual Activity  . Alcohol use: No  . Drug use: No  . Sexual activity: Not Currently    Birth control/protection: None  Lifestyle  . Physical activity    Days per week: Not on file    Minutes per session: Not on file  . Stress: Not on file  Relationships  . Social Herbalist on phone: Not on file    Gets together: Not on file    Attends religious service: Not on file    Active member of club or organization: Not on file    Attends meetings of clubs or organizations: Not on file    Relationship status: Not on file  . Intimate partner violence    Fear of current or  ex partner: Not on file    Emotionally abused: Not on file    Physically abused: Not on file    Forced sexual activity: Not on file  Other Topics Concern  . Not on file  Social History Narrative  . Not on file    Allergies:  No Known Allergies  Medications: Prior to Admission medications   Medication Sig Start Date End Date Taking? Authorizing Provider  carbamazepine (CARBATROL) 300 MG 12 hr capsule Take 600 mg by mouth 2 (two) times daily. 09/18/18  Yes [provider]  levETIRAcetam (KEPPRA) 500 MG tablet Take 750-1,000 mg by mouth 2 (two) times daily. 09/18/18  Yes [provider]  acetaminophen-codeine 120-12 MG/5ML solution Take 10 mLs by mouth every 4 (four) hours as needed for moderate pain. Patient not taking: Reported on 09/17/2017 07/13/17   Dalia Heading,  PA-C  docusate sodium (COLACE) 100 MG capsule Take 1 capsule (100 mg total) by mouth every 12 (twelve) hours. Patient not taking: Reported on 09/17/2017 08/10/16   Waynetta Pean, PA-C  Ferrous Fumarate (HEMOCYTE - 106 MG FE) 324 (106 Fe) MG TABS tablet Take 1 tablet (106 mg of iron total) by mouth 2 (two) times daily. Patient not taking: Reported on 08/10/2016 05/24/16   Gae Dry, MD  ferrous sulfate 325 (65 FE) MG tablet Take 1 tablet (325 mg total) by mouth daily. Patient not taking: Reported on 09/17/2017 08/10/16   Waynetta Pean, PA-C  Guaifenesin 1200 MG TB12 Take 1 tablet (1,200 mg total) by mouth 2 (two) times daily. Patient not taking: Reported on 09/17/2017 07/13/17   Dalia Heading, PA-C  ibuprofen (ADVIL,MOTRIN) 800 MG tablet Take 1 tablet (800 mg total) by mouth every 8 (eight) hours as needed. Patient not taking: Reported on 09/17/2017 07/13/17   Dalia Heading, PA-C  Vitamin D, Ergocalciferol, (DRISDOL) 50000 units CAPS capsule Take 50,000 Units by mouth every Monday. 06/08/15   [provider]    Physical Exam Vitals: Blood pressure 119/81, pulse 86, temperature 99 F (37.2 C), temperature source Oral, resp. rate 16, height 5\' 5"  (1.651 m), weight 79.4 kg, SpO2 100 %. General: NAD, well nourished, appears stated age, pale HEENT: normocephalic, anicteric Pulmonary: No increased work of breathing Cardiovascular: RRR Genitourinary: deferred Extremities: no edema, erythema, or tenderness Neurologic: Grossly intact Psychiatric: mood appropriate, affect full  Labs: Results for orders placed or performed during the hospital encounter of 03/14/19 (from the past 72 hour(s))  CBC with Differential     Status: Abnormal   Collection Time: 03/14/19  4:48 PM  Result Value Ref Range   WBC 7.7 4.0 - 10.5 K/uL   RBC 2.53 (L) 3.87 - 5.11 MIL/uL   Hemoglobin 4.0 (LL) 12.0 - 15.0 g/dL    Comment: Reticulocyte Hemoglobin testing may be clinically indicated, consider  ordering this additional test [id number] THIS CRITICAL RESULT HAS VERIFIED AND BEEN CALLED TO ANNIE SMITH BY CHERYL FLORENCE ON 10 24 2020 AT 1804, AND HAS BEEN READ BACK. RESULTS READ BACK    HCT 16.5 (L) 36.0 - 46.0 %   MCV 65.2 (L) 80.0 - 100.0 fL   MCH 15.8 (L) 26.0 - 34.0 pg   MCHC 24.2 (L) 30.0 - 36.0 g/dL   RDW 22.1 (H) 11.5 - 15.5 %   Platelets 201 150 - 400 K/uL   nRBC 0.3 (H) 0.0 - 0.2 %   Neutrophils Relative % 69 %   Neutro Abs 5.3 1.7 - 7.7 K/uL   Lymphocytes Relative 21 %  Lymphs Abs 1.7 0.7 - 4.0 K/uL   Monocytes Relative 8 %   Monocytes Absolute 0.6 0.1 - 1.0 K/uL   Eosinophils Relative 1 %   Eosinophils Absolute 0.1 0.0 - 0.5 K/uL   Basophils Relative 1 %   Basophils Absolute 0.0 0.0 - 0.1 K/uL   Immature Granulocytes 0 %   Abs Immature Granulocytes 0.02 0.00 - 0.07 K/uL    Comment: Performed at [facility], [address] [place], [place] [address]  Type and screen [place]     Status: None (Preliminary result)   Collection Time: 03/14/19  4:48 PM  Result Value Ref Range   ABO/RH(D) O POS    Antibody Screen NEG    Sample Expiration 03/17/2019,2359    Unit Number [id number]    Blood Component Type RED CELLS,LR    Unit division 00    Status of Unit ISSUED    Transfusion Status OK TO TRANSFUSE    Crossmatch Result      Compatible Performed at [facility], [address] [place], [place] [address]    Unit Number [id number]    Blood Component Type RED CELLS,LR    Unit division 00    Status of Unit ALLOCATED    Transfusion Status OK TO TRANSFUSE    Crossmatch Result Compatible    Unit Number [id number]    Blood Component Type RED CELLS,LR    Unit division 00    Status of Unit ALLOCATED    Transfusion Status OK TO TRANSFUSE    Crossmatch Result Compatible   Prepare RBC     Status: None   Collection Time: 03/14/19  6:11 PM  Result Value Ref Range   Order Confirmation      ORDER PROCESSED BY BLOOD  BANK Performed at [facility], [address], [place], [place] [address]   SARS Coronavirus 2 by RT PCR (hospital order, performed in [facility] hospital lab) Nasopharyngeal Nasopharyngeal Swab     Status: None   Collection Time: 03/14/19  6:17 PM   Specimen: Nasopharyngeal Swab  Result Value Ref Range   SARS Coronavirus 2 NEGATIVE NEGATIVE    Comment: (NOTE) If result is NEGATIVE SARS-CoV-2 target nucleic acids are NOT DETECTED. The SARS-CoV-2 RNA is generally detectable in upper and lower  respiratory specimens during the acute phase of infection. The lowest  concentration of SARS-CoV-2 viral copies this assay can detect is 250  copies / mL. A negative result does not preclude SARS-CoV-2 infection  and should not be used as the sole basis for treatment or other  patient management decisions.  A negative result may occur with  improper specimen collection / handling, submission of specimen other  than nasopharyngeal swab, presence of viral mutation(s) within the  areas targeted by this assay, and inadequate number of viral copies  (<250 copies / mL). A negative result must be combined with clinical  observations, patient history, and epidemiological information. If result is POSITIVE SARS-CoV-2 target nucleic acids are DETECTED. The SARS-CoV-2 RNA is generally detectable in upper and lower  respiratory specimens dur ing the acute phase of infection.  Positive  results are indicative of active infection with SARS-CoV-2.  Clinical  correlation with patient history and other diagnostic information is  necessary to determine patient infection status.  Positive results do  not rule out bacterial infection or co-infection with other viruses. If result is PRESUMPTIVE POSTIVE SARS-CoV-2 nucleic acids MAY BE PRESENT.   A presumptive positive result  was obtained on the submitted specimen  and confirmed on repeat testing.  While 2019 novel coronavirus  (SARS-CoV-2) nucleic acids  may be present in the submitted sample  additional confirmatory testing may be necessary for epidemiological  and / or clinical management purposes  to differentiate between  SARS-CoV-2 and other Sarbecovirus currently known to infect humans.  If clinically indicated additional testing with an alternate test  methodology [phone]) is advised. The SARS-CoV-2 RNA is generally  detectable in upper and lower respiratory sp ecimens during the acute  phase of infection. The expected result is Negative. Fact Sheet for Patients:  StrictlyIdeas.no Fact Sheet for Healthcare Providers: BankingDealers.co.za This test is not yet approved or cleared by the [place] FDA and has been authorized for detection and/or diagnosis of SARS-CoV-2 by FDA under an Emergency Use Authorization (EUA).  This EUA will remain in effect (meaning this test can be used) for the duration of the COVID-19 declaration under Section 564(b)(1) of the Act, 21 U.S.C. section 360bbb-3(b)(1), unless the authorization is terminated or revoked sooner. Performed at [facility], [address], [place], [place] [address]     Imaging No results found.  Assessment: 43 y.o. G2P0011 with menorrhagia to anemia  Plan:  1) Discussed management options for abnormal uterine bleeding including expectant, NSAIDs, tranexamic acid (Lysteda), oral progesterone (Provera, norethindrone, megace), Depo Provera, Levonorgestrel containing IUD, endometrial ablation (Novasure) or hysterectomy as definitive surgical management.  Discussed risks and benefits of each method.   Final management decision will hinge on results of patient's work up and whether an underlying etiology for the patients bleeding symptoms can be discerned.  We will conduct a basic work up examining using the PALM-COIEN classification system.  - prior uterine imaging from 2018 and 2019 reviewed and normal.  2018 imaging  made mention of a possible focal endometrial abnormality which was not apparent on imaging in 2019 - TSH, FSH/LH, estradiol, and prolactin.  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

## 2019-03-14 NOTE — ED Provider Notes (Signed)
[facility] Emergency Department Provider Note  ____________________________________________   First MD Initiated Contact with Patient 03/14/19 1730     (approximate)  I have reviewed the triage vital signs and the nursing notes.   HISTORY  Chief Complaint Vaginal Bleeding    HPI Sheila Moore is a 43 y.o. female  Here with vaginal bleeding and weakness. Pt has a reported h/o DUB requiring transfusion. She states that over the past several months, she has had increasingly irregular periods. She will intermitetntly have heavy then light bleeding with occasional cramps, which is new for her. Over the last 2 weeks, she has had persistent, severe bleeding and passage of small to moderate clots. She states she has been soaking multiple pads and tampons daily and will bleed spontaneously any time she moves. She's noticed occasional dripping in the toilet when sitting to urinate. No h/o blood thinner use. She has required transfusion in past for this. She endorses associated mild lightheadedness w/ standing, SOB with exertion, and fatigue. No ongoing abd pain.        Past Medical History:  Diagnosis Date  . Blood transfusion without reported diagnosis   . Brain tumor (benign) ([place])   . Seizures [facility])     Patient Active Problem List   Diagnosis Date Noted  . Menorrhagia 05/23/2016  . Anemia 05/23/2016  . Nipple discharge 11/06/2012    Past Surgical History:  Procedure Laterality Date  . BRAIN SURGERY  2010   tumor removed    Prior to Admission medications   Medication Sig Start Date End Date Taking? Authorizing Provider  carbamazepine (CARBATROL) 300 MG 12 hr capsule Take 600 mg by mouth 2 (two) times daily. 09/18/18  Yes [provider]  levETIRAcetam (KEPPRA) 500 MG tablet Take 750-1,000 mg by mouth 2 (two) times daily. 09/18/18  Yes [provider]  acetaminophen-codeine 120-12 MG/5ML solution Take 10 mLs by mouth every 4 (four)  hours as needed for moderate pain. Patient not taking: Reported on 09/17/2017 07/13/17   Dalia Heading, PA-C  docusate sodium (COLACE) 100 MG capsule Take 1 capsule (100 mg total) by mouth every 12 (twelve) hours. Patient not taking: Reported on 09/17/2017 08/10/16   Waynetta Pean, PA-C  Ferrous Fumarate (HEMOCYTE - 106 MG FE) 324 (106 Fe) MG TABS tablet Take 1 tablet (106 mg of iron total) by mouth 2 (two) times daily. Patient not taking: Reported on 08/10/2016 05/24/16   Gae Dry, MD  ferrous sulfate 325 (65 FE) MG tablet Take 1 tablet (325 mg total) by mouth daily. Patient not taking: Reported on 09/17/2017 08/10/16   Waynetta Pean, PA-C  Guaifenesin 1200 MG TB12 Take 1 tablet (1,200 mg total) by mouth 2 (two) times daily. Patient not taking: Reported on 09/17/2017 07/13/17   Dalia Heading, PA-C  ibuprofen (ADVIL,MOTRIN) 800 MG tablet Take 1 tablet (800 mg total) by mouth every 8 (eight) hours as needed. Patient not taking: Reported on 09/17/2017 07/13/17   Dalia Heading, PA-C  Vitamin D, Ergocalciferol, (DRISDOL) 50000 units CAPS capsule Take 50,000 Units by mouth every Monday. 06/08/15   [provider]    Allergies Patient has no known allergies.  Family History  Problem Relation Age of Onset  . Hypertension Mother   . COPD Mother     Social History Social History   Tobacco Use  . Smoking status: Never Smoker  . Smokeless tobacco: Never Used  Substance Use Topics  . Alcohol use: No  . Drug use:  No    Review of Systems  Review of Systems  Constitutional: Positive for fatigue. Negative for fever.  HENT: Negative for congestion and sore throat.   Eyes: Negative for visual disturbance.  Respiratory: Positive for shortness of breath. Negative for cough.   Cardiovascular: Positive for palpitations. Negative for chest pain.  Gastrointestinal: Negative for abdominal pain, diarrhea, nausea and vomiting.  Genitourinary: Positive for menstrual problem  and vaginal bleeding. Negative for flank pain.  Musculoskeletal: Negative for back pain and neck pain.  Skin: Negative for rash and wound.  Neurological: Positive for weakness and light-headedness.  All other systems reviewed and are negative.    ____________________________________________  PHYSICAL EXAM:      VITAL SIGNS: ED Triage Vitals  Enc Vitals Group     BP 03/14/19 1645 140/72     Pulse Rate 03/14/19 1645 99     Resp 03/14/19 1645 20     Temp 03/14/19 1645 98.5 F (36.9 C)     Temp Source 03/14/19 1645 Oral     SpO2 03/14/19 1645 100 %     Weight 03/14/19 1646 175 lb (79.4 kg)     Height 03/14/19 1646 5\' 5"  (1.651 m)     Head Circumference --      Peak Flow --      Pain Score 03/14/19 1646 0     Pain Loc --      Pain Edu? --      Excl. in [place]? --      Physical Exam Vitals signs and nursing note reviewed.  Constitutional:      General: She is not in acute distress.    Appearance: She is well-developed.  HENT:     Head: Normocephalic and atraumatic.  Eyes:     Conjunctiva/sclera: Conjunctivae normal.  Neck:     Musculoskeletal: Neck supple.  Cardiovascular:     Rate and Rhythm: Normal rate and regular rhythm.     Heart sounds: Normal heart sounds. No murmur. No friction rub.  Pulmonary:     Effort: Pulmonary effort is normal. No respiratory distress.     Breath sounds: Normal breath sounds. No wheezing or rales.  Abdominal:     General: There is no distension.     Palpations: Abdomen is soft.     Tenderness: There is no abdominal tenderness.  Genitourinary:    Comments: Moderate volume dark red blood in vaginal vault. Small clot (approx quarter-sized) noted at os, but os closed with no active bleeding noted.  Skin:    General: Skin is warm.     Capillary Refill: Capillary refill takes less than 2 seconds.  Neurological:     Mental Status: She is alert and oriented to person, place, and time.     Motor: No abnormal muscle tone.        ____________________________________________   LABS (all labs ordered are listed, but only abnormal results are displayed)  Labs Reviewed  CBC WITH DIFFERENTIAL/PLATELET - Abnormal; Notable for the following components:      Result Value   RBC 2.53 (*)    Hemoglobin 4.0 (*)    HCT 16.5 (*)    MCV 65.2 (*)    MCH 15.8 (*)    MCHC 24.2 (*)    RDW 22.1 (*)    nRBC 0.3 (*)    All other components within normal limits  BASIC METABOLIC PANEL - Abnormal; Notable for the following components:   Glucose, Bld 113 (*)    All other  components within normal limits  SARS CORONAVIRUS 2 BY RT PCR (HOSPITAL ORDER, [place] [facility])  HCG, QUANTITATIVE, PREGNANCY  TSH  CBC  ESTRADIOL  FSH/LH  PROLACTIN  TYPE AND SCREEN  PREPARE RBC (CROSSMATCH)    ____________________________________________  EKG: None ________________________________________  RADIOLOGY All imaging, including plain films, CT scans, and ultrasounds, independently reviewed by me, and interpretations confirmed via formal radiology reads.  ED MD interpretation:   None  Official radiology report(s): No results found.  ____________________________________________  PROCEDURES   Procedure(s) performed (including Critical Care):  .Critical Care Performed by: Duffy Bruce, MD Authorized by: Duffy Bruce, MD   Critical care provider statement:    Critical care time (minutes):  35   Critical care time was exclusive of:  Separately billable procedures and treating other patients and teaching time   Critical care was necessary to treat or prevent imminent or life-threatening deterioration of the following conditions:  Circulatory failure and cardiac failure   Critical care was time spent personally by me on the following activities:  Development of treatment plan with patient or surrogate, discussions with consultants, evaluation of patient's response to treatment, examination of patient,  obtaining history from patient or surrogate, ordering and performing treatments and interventions, ordering and review of laboratory studies, ordering and review of radiographic studies, pulse oximetry, re-evaluation of patient's condition and review of old charts   I assumed direction of critical care for this patient from another provider in my specialty: no      ____________________________________________  INITIAL IMPRESSION / MDM / [place] / ED COURSE  As part of my medical decision making, I reviewed the following data within the electronic MEDICAL RECORD NUMBER Notes from prior ED visits and [place] Controlled Substance Database      *IRHA ARNET was evaluated in Emergency Department on 03/15/2019 for the symptoms described in the history of present illness. She was evaluated in the context of the global COVID-19 pandemic, which necessitated consideration that the patient might be at risk for infection with the SARS-CoV-2 virus that causes COVID-19. Institutional protocols and algorithms that pertain to the evaluation of patients at risk for COVID-19 are in a state of rapid change based on information released by regulatory bodies including the CDC and federal and state organizations. These policies and algorithms were followed during the patient's care in the ED.  Some ED evaluations and interventions may be delayed as a result of limited staffing during the pandemic.*      Medical Decision Making:  43 yo F here with symptomatic anemia likely 2/2 DUB. Hgb 4.0 on CBC here. No brisk bleeding noted on exam. Case discussed with Dr. Georgianne Fick, will transfuse and admit.   ____________________________________________  FINAL CLINICAL IMPRESSION(S) / ED DIAGNOSES  Final diagnoses:  Vaginal bleeding  Symptomatic anemia     MEDICATIONS GIVEN DURING THIS VISIT:  Medications  carbamazepine (TEGRETOL XR) 12 hr tablet 600 mg (600 mg Oral Given 03/14/19 2335)  prenatal multivitamin  tablet 1 tablet (has no administration in time range)  0.9 %  sodium chloride infusion (has no administration in time range)  medroxyPROGESTERone (PROVERA) tablet 20 mg (has no administration in time range)  levETIRAcetam (KEPPRA) tablet 1,000 mg (has no administration in time range)    And  levETIRAcetam (KEPPRA) tablet 750 mg (750 mg Oral Given 03/14/19 2334)     ED Discharge Orders    None       Note:  This document was prepared using  [employer] training and education officer and may include unintentional dictation errors.   Duffy Bruce, MD 03/15/19 [phone]

## 2019-03-14 NOTE — ED Triage Notes (Signed)
Vaginal bleeding x 3 weeks. Has gone through 36 tampons and 20 pads in 2 days. Heavy bleed.

## 2019-03-15 DIAGNOSIS — D500 Iron deficiency anemia secondary to blood loss (chronic): Secondary | ICD-10-CM | POA: Diagnosis not present

## 2019-03-15 DIAGNOSIS — N920 Excessive and frequent menstruation with regular cycle: Secondary | ICD-10-CM | POA: Diagnosis not present

## 2019-03-15 LAB — CBC
HCT: 23.3 % — ABNORMAL LOW (ref 36.0–46.0)
Hemoglobin: 6.7 g/dL — ABNORMAL LOW (ref 12.0–15.0)
MCH: 21.2 pg — ABNORMAL LOW (ref 26.0–34.0)
MCHC: 28.8 g/dL — ABNORMAL LOW (ref 30.0–36.0)
MCV: 73.7 fL — ABNORMAL LOW (ref 80.0–100.0)
Platelets: 180 10*3/uL (ref 150–400)
RBC: 3.16 MIL/uL — ABNORMAL LOW (ref 3.87–5.11)
RDW: 25.1 % — ABNORMAL HIGH (ref 11.5–15.5)
WBC: 9.3 10*3/uL (ref 4.0–10.5)
nRBC: 0 % (ref 0.0–0.2)

## 2019-03-15 MED ORDER — MEDROXYPROGESTERONE ACETATE 10 MG PO TABS: ORAL_TABLET | ORAL | 0 refills | Status: DC

## 2019-03-15 MED ORDER — INFLUENZA VAC SPLIT QUAD 0.5 ML IM SUSY
0.5000 mL | PREFILLED_SYRINGE | INTRAMUSCULAR | Status: AC
  Administered 2019-03-15: 0.5 mL via INTRAMUSCULAR

## 2019-03-15 MED ORDER — FUSION 65-65-25-30 MG PO CAPS: 1.0000 | ORAL_CAPSULE | Freq: Every day | ORAL | 11 refills | Status: DC

## 2019-03-15 NOTE — Progress Notes (Signed)
Obstetric and Gynecology  Subjective  Doing well, bleeding has almost complete stopped on provera. Feeling much better after two units of pRBC  Objective  Vital signs in last 24 hours: Temp:  [97.8 F (36.6 C)-99 F (37.2 C)] 98.1 F (36.7 C) (10/25 0806) Pulse Rate:  [65-99] 65 (10/25 0806) Resp:  [16-20] 20 (10/25 0806) BP: (105-140)/(72-81) 112/74 (10/25 0806) SpO2:  [100 %] 100 % (10/25 0806) Weight:  [79.4 kg] 79.4 kg (10/24 1646)     Intake/Output Summary (Last 24 hours) at 03/15/2019 0936 Last data filed at 03/15/2019 0400 Gross per 24 hour  Intake 877.8 ml  Output 400 ml  Net 477.8 ml    General: NAD Pulmonary: no increased work of breathing Extremities: no edema  Labs: Results for orders placed or performed during the hospital encounter of 03/14/19 (from the past 24 hour(s))  CBC with Differential     Status: Abnormal   Collection Time: 03/14/19  4:48 PM  Result Value Ref Range   WBC 7.7 4.0 - 10.5 K/uL   RBC 2.53 (L) 3.87 - 5.11 MIL/uL   Hemoglobin 4.0 (LL) 12.0 - 15.0 g/dL   HCT 16.5 (L) 36.0 - 46.0 %   MCV 65.2 (L) 80.0 - 100.0 fL   MCH 15.8 (L) 26.0 - 34.0 pg   MCHC 24.2 (L) 30.0 - 36.0 g/dL   RDW 22.1 (H) 11.5 - 15.5 %   Platelets 201 150 - 400 K/uL   nRBC 0.3 (H) 0.0 - 0.2 %   Neutrophils Relative % 69 %   Neutro Abs 5.3 1.7 - 7.7 K/uL   Lymphocytes Relative 21 %   Lymphs Abs 1.7 0.7 - 4.0 K/uL   Monocytes Relative 8 %   Monocytes Absolute 0.6 0.1 - 1.0 K/uL   Eosinophils Relative 1 %   Eosinophils Absolute 0.1 0.0 - 0.5 K/uL   Basophils Relative 1 %   Basophils Absolute 0.0 0.0 - 0.1 K/uL   Immature Granulocytes 0 %   Abs Immature Granulocytes 0.02 0.00 - 0.07 K/uL  Type and screen [facility]     Status: None (Preliminary result)   Collection Time: 03/14/19  4:48 PM  Result Value Ref Range   ABO/RH(D) O POS    Antibody Screen NEG    Sample Expiration 03/17/2019,2359    Unit Number [id number]    Blood Component  Type RED CELLS,LR    Unit division 00    Status of Unit ISSUED,FINAL    Transfusion Status OK TO TRANSFUSE    Crossmatch Result Compatible    Unit Number [id number]    Blood Component Type RED CELLS,LR    Unit division 00    Status of Unit ISSUED,FINAL    Transfusion Status OK TO TRANSFUSE    Crossmatch Result      Compatible Performed at [facility], [address], [place], [place] [address]    Unit Number [id number]    Blood Component Type RED CELLS,LR    Unit division 00    Status of Unit ALLOCATED    Transfusion Status OK TO TRANSFUSE    Crossmatch Result Compatible   Basic metabolic panel     Status: Abnormal   Collection Time: 03/14/19  4:48 PM  Result Value Ref Range   Sodium 139 135 - 145 mmol/L   Potassium 3.8 3.5 - 5.1 mmol/L   Chloride 106 98 - 111 mmol/L   CO2 25 22 - 32 mmol/L   Glucose, Bld 113 (H)  70 - 99 mg/dL   BUN 11 6 - 20 mg/dL   Creatinine, Ser 0.75 0.44 - 1.00 mg/dL   Calcium 9.0 8.9 - 10.3 mg/dL   GFR calc non Af Amer >60 >60 mL/min   GFR calc Af Amer >60 >60 mL/min   Anion gap 8 5 - 15  hCG, quantitative, pregnancy     Status: None   Collection Time: 03/14/19  4:48 PM  Result Value Ref Range   hCG, Beta Chain, Quant, S <1 <5 mIU/mL  TSH     Status: None   Collection Time: 03/14/19  4:48 PM  Result Value Ref Range   TSH 1.487 0.350 - 4.500 uIU/mL  Prepare RBC     Status: None   Collection Time: 03/14/19  6:11 PM  Result Value Ref Range   Order Confirmation      ORDER PROCESSED BY BLOOD BANK Performed at [facility], [address] [place], [place] [address]   SARS Coronavirus 2 by RT PCR (hospital order, performed in [place] hospital lab) Nasopharyngeal Nasopharyngeal Swab     Status: None   Collection Time: 03/14/19  6:17 PM   Specimen: Nasopharyngeal Swab  Result Value Ref Range   SARS Coronavirus 2 NEGATIVE NEGATIVE  CBC     Status: Abnormal   Collection Time: 03/15/19  6:19 AM  Result Value Ref Range    WBC 9.3 4.0 - 10.5 K/uL   RBC 3.16 (L) 3.87 - 5.11 MIL/uL   Hemoglobin 6.7 (L) 12.0 - 15.0 g/dL   HCT 23.3 (L) 36.0 - 46.0 %   MCV 73.7 (L) 80.0 - 100.0 fL   MCH 21.2 (L) 26.0 - 34.0 pg   MCHC 28.8 (L) 30.0 - 36.0 g/dL   RDW 25.1 (H) 11.5 - 15.5 %   Platelets 180 150 - 400 K/uL   nRBC 0.0 0.0 - 0.2 %    Cultures: Results for orders placed or performed during the hospital encounter of 03/14/19  SARS Coronavirus 2 by RT PCR (hospital order, performed in [place] hospital lab) Nasopharyngeal Nasopharyngeal Swab     Status: None   Collection Time: 03/14/19  6:17 PM   Specimen: Nasopharyngeal Swab  Result Value Ref Range Status   SARS Coronavirus 2 NEGATIVE NEGATIVE Final    Comment: (NOTE) If result is NEGATIVE SARS-CoV-2 target nucleic acids are NOT DETECTED. The SARS-CoV-2 RNA is generally detectable in upper and lower  respiratory specimens during the acute phase of infection. The lowest  concentration of SARS-CoV-2 viral copies this assay can detect is 250  copies / mL. A negative result does not preclude SARS-CoV-2 infection  and should not be used as the sole basis for treatment or other  patient management decisions.  A negative result may occur with  improper specimen collection / handling, submission of specimen other  than nasopharyngeal swab, presence of viral mutation(s) within the  areas targeted by this assay, and inadequate number of viral copies  (<250 copies / mL). A negative result must be combined with clinical  observations, patient history, and epidemiological information. If result is POSITIVE SARS-CoV-2 target nucleic acids are DETECTED. The SARS-CoV-2 RNA is generally detectable in upper and lower  respiratory specimens dur ing the acute phase of infection.  Positive  results are indicative of active infection with SARS-CoV-2.  Clinical  correlation with patient history and other diagnostic information is  necessary to determine patient infection  status.  Positive results do  not rule out bacterial  infection or co-infection with other viruses. If result is PRESUMPTIVE POSTIVE SARS-CoV-2 nucleic acids MAY BE PRESENT.   A presumptive positive result was obtained on the submitted specimen  and confirmed on repeat testing.  While 2019 novel coronavirus  (SARS-CoV-2) nucleic acids may be present in the submitted sample  additional confirmatory testing may be necessary for epidemiological  and / or clinical management purposes  to differentiate between  SARS-CoV-2 and other Sarbecovirus currently known to infect humans.  If clinically indicated additional testing with an alternate test  methodology [phone]) is advised. The SARS-CoV-2 RNA is generally  detectable in upper and lower respiratory sp ecimens during the acute  phase of infection. The expected result is Negative. Fact Sheet for Patients:  StrictlyIdeas.no Fact Sheet for Healthcare Providers: BankingDealers.co.za This test is not yet approved or cleared by the [place] FDA and has been authorized for detection and/or diagnosis of SARS-CoV-2 by FDA under an Emergency Use Authorization (EUA).  This EUA will remain in effect (meaning this test can be used) for the duration of the COVID-19 declaration under Section 564(b)(1) of the Act, 21 U.S.C. section 360bbb-3(b)(1), unless the authorization is terminated or revoked sooner. Performed at [facility], [address] [place], [place] [address]     Imaging:  Assessment   43 y.o. 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   - FSH/LH, TSH, prolactin, and estradiol pending - plan for 1 more unit of pRBC prior to discharge - continue provera outpatient as well as fusion plus iron - Pap and endometrial biopsy outpatient with  plan for hysterectomy for long term management  2) Disposition - anticipate home after 3rd unit today

## 2019-03-15 NOTE — Progress Notes (Signed)
Patient discharged to home. Medications/Prescriptions and discharge instructions reviewed with patient who verbalized understanding. Pt discharged via W/C. Will schedule follow-up as instructed next week.

## 2019-03-15 NOTE — Plan of Care (Signed)

## 2019-03-15 NOTE — Discharge Summary (Signed)
Physician Discharge Summary  Patient ID: Sheila Moore MRN: [id number] DOB/AGE: 43-Jan-1977 43 y.o.  Admit date: 03/14/2019 Discharge date: 03/15/2019  Admission Diagnoses:  1) Menorrhagia 2) Symptomatic acute blood loss anemia  Discharge Diagnoses:  Active Problems:   Menorrhagia   Discharged Condition: good  Hospital Course: 43 y.o. with menorrhagia to anemia presenting to emergency department on 03/14/2019.  Patient was admitted for blood transfusion ultimately received 3 Units of pRBC prior to discharge.  She was started on provera tapper with good response in bleeding.  Work up started for bleeding with concern for pituitary etiology given prior imaging.  The patient remained hemodynamically stable throughout admission.   Consults: None  Significant Diagnostic Studies:  Results for orders placed or performed during the hospital encounter of 03/14/19 (from the past 24 hour(s))  CBC with Differential     Status: Abnormal   Collection Time: 03/14/19  4:48 PM  Result Value Ref Range   WBC 7.7 4.0 - 10.5 K/uL   RBC 2.53 (L) 3.87 - 5.11 MIL/uL   Hemoglobin 4.0 (LL) 12.0 - 15.0 g/dL   HCT 16.5 (L) 36.0 - 46.0 %   MCV 65.2 (L) 80.0 - 100.0 fL   MCH 15.8 (L) 26.0 - 34.0 pg   MCHC 24.2 (L) 30.0 - 36.0 g/dL   RDW 22.1 (H) 11.5 - 15.5 %   Platelets 201 150 - 400 K/uL   nRBC 0.3 (H) 0.0 - 0.2 %   Neutrophils Relative % 69 %   Neutro Abs 5.3 1.7 - 7.7 K/uL   Lymphocytes Relative 21 %   Lymphs Abs 1.7 0.7 - 4.0 K/uL   Monocytes Relative 8 %   Monocytes Absolute 0.6 0.1 - 1.0 K/uL   Eosinophils Relative 1 %   Eosinophils Absolute 0.1 0.0 - 0.5 K/uL   Basophils Relative 1 %   Basophils Absolute 0.0 0.0 - 0.1 K/uL   Immature Granulocytes 0 %   Abs Immature Granulocytes 0.02 0.00 - 0.07 K/uL  Type and screen [facility]     Status: None (Preliminary result)   Collection Time: 03/14/19  4:48 PM  Result Value Ref Range   ABO/RH(D) O POS    Antibody Screen NEG     Sample Expiration 03/17/2019,2359    Unit Number [id number]    Blood Component Type RED CELLS,LR    Unit division 00    Status of Unit ISSUED,FINAL    Transfusion Status OK TO TRANSFUSE    Crossmatch Result Compatible    Unit Number [id number]    Blood Component Type RED CELLS,LR    Unit division 00    Status of Unit ISSUED,FINAL    Transfusion Status OK TO TRANSFUSE    Crossmatch Result Compatible    Unit Number [id number]    Blood Component Type RED CELLS,LR    Unit division 00    Status of Unit ISSUED    Transfusion Status OK TO TRANSFUSE    Crossmatch Result      Compatible Performed at [facility], [address] [place], [place] [id number]   Basic metabolic panel     Status: Abnormal   Collection Time: 03/14/19  4:48 PM  Result Value Ref Range   Sodium 139 135 - 145 mmol/L   Potassium 3.8 3.5 - 5.1 mmol/L   Chloride 106 98 - 111 mmol/L   CO2 25 22 - 32 mmol/L   Glucose, Bld 113 (H) 70 - 99 mg/dL   BUN 11 6 -  20 mg/dL   Creatinine, Ser 0.75 0.44 - 1.00 mg/dL   Calcium 9.0 8.9 - 10.3 mg/dL   GFR calc non Af Amer >60 >60 mL/min   GFR calc Af Amer >60 >60 mL/min   Anion gap 8 5 - 15  hCG, quantitative, pregnancy     Status: None   Collection Time: 03/14/19  4:48 PM  Result Value Ref Range   hCG, Beta Chain, Quant, S <1 <5 mIU/mL  TSH     Status: None   Collection Time: 03/14/19  4:48 PM  Result Value Ref Range   TSH 1.487 0.350 - 4.500 uIU/mL  Prepare RBC     Status: None   Collection Time: 03/14/19  6:11 PM  Result Value Ref Range   Order Confirmation      ORDER PROCESSED BY BLOOD BANK Performed at [facility], [address] [place], [place] [address]   SARS Coronavirus 2 by RT PCR (hospital order, performed in [place] hospital lab) Nasopharyngeal Nasopharyngeal Swab     Status: None   Collection Time: 03/14/19  6:17 PM   Specimen: Nasopharyngeal Swab  Result Value Ref Range   SARS Coronavirus 2 NEGATIVE NEGATIVE  CBC      Status: Abnormal   Collection Time: 03/15/19  6:19 AM  Result Value Ref Range   WBC 9.3 4.0 - 10.5 K/uL   RBC 3.16 (L) 3.87 - 5.11 MIL/uL   Hemoglobin 6.7 (L) 12.0 - 15.0 g/dL   HCT 23.3 (L) 36.0 - 46.0 %   MCV 73.7 (L) 80.0 - 100.0 fL   MCH 21.2 (L) 26.0 - 34.0 pg   MCHC 28.8 (L) 30.0 - 36.0 g/dL   RDW 25.1 (H) 11.5 - 15.5 %   Platelets 180 150 - 400 K/uL   nRBC 0.0 0.0 - 0.2 %     Treatments: IV hydration and blood transfusion  Discharge Exam: Blood pressure 117/85, pulse 70, temperature 98.7 F (37.1 C), temperature source Oral, resp. rate 16, height 5\' 5"  (1.651 m), weight 79.4 kg, SpO2 100 %. General appearance: alert, appears stated age and no distress Resp: no increased work of breathing  Disposition: Discharge disposition: 01-Home or Self Care       Discharge Instructions    Activity as tolerated   Complete by: As directed    Call MD for:   Complete by: As directed    For heavy vaginal bleeding greater than 1 pad an hour   Call MD for:  difficulty breathing, headache or visual disturbances   Complete by: As directed    Call MD for:  extreme fatigue   Complete by: As directed    Call MD for:  hives   Complete by: As directed    Call MD for:  persistant dizziness or light-headedness   Complete by: As directed    Call MD for:  persistant nausea and vomiting   Complete by: As directed    Call MD for:  severe uncontrolled pain   Complete by: As directed    Call MD for:  temperature >100.4   Complete by: As directed    Diet general   Complete by: As directed      Allergies as of 03/15/2019   No Known Allergies     Medication List    STOP taking these medications   acetaminophen-codeine 120-12 MG/5ML solution   Ferrous Fumarate 324 (106 Fe) MG Tabs tablet Commonly known as: HEMOCYTE - 106 mg FE   ferrous  sulfate 325 (65 FE) MG tablet   Guaifenesin 1200 MG Tb12   ibuprofen 800 MG tablet Commonly known as: ADVIL     TAKE these medications    carbamazepine 300 MG 12 hr capsule Commonly known as: CARBATROL Take 600 mg by mouth 2 (two) times daily.   docusate sodium 100 MG capsule Commonly known as: COLACE Take 1 capsule (100 mg total) by mouth every 12 (twelve) hours.   Fusion 65-65-25-30 MG Caps Take 1 tablet by mouth daily.   levETIRAcetam 500 MG tablet Commonly known as: KEPPRA Take 750-1,000 mg by mouth 2 (two) times daily.   medroxyPROGESTERone 10 MG tablet Commonly known as: Provera Take 2 tablets (20mg ) po tid x 7 days, then 2 tablets (20mg ) po once daily maintenance after the initial 7 days   Vitamin D (Ergocalciferol) 1.25 MG (50000 UT) Caps capsule Commonly known as: DRISDOL Take 50,000 Units by mouth every Monday.      Follow-up Information    Malachy Mood, MD. Schedule an appointment as soon as possible for a visit in 1 week(s).   Specialty: Obstetrics and Gynecology Contact information: [address] [place] [address] [phone]           Signed: Malachy Mood 03/15/2019, 2:02 PM

## 2019-03-15 NOTE — Plan of Care (Addendum)
Patient discharged to home. Medications/Prescriptions and discharge instructions reviewed with patient who verbalized understanding. Pt discharged with family via W/C. Will schedule follow-up as instructed.  Flu vaccine given at time of discharge per patient request.

## 2019-03-15 NOTE — Progress Notes (Signed)
Blood transfusion completed. VS reamin WNL. Pt denies any symptoms of reaction. States she feels much better and is very appreciative of care.

## 2019-03-16 LAB — TYPE AND SCREEN
ABO/RH(D): O POS
Antibody Screen: NEGATIVE
Unit division: 0
Unit division: 0
Unit division: 0

## 2019-03-16 LAB — BPAM RBC
Blood Product Expiration Date: 202011252359
Blood Product Expiration Date: 202011262359
Blood Product Expiration Date: 202011262359
ISSUE DATE / TIME: 202010241923
ISSUE DATE / TIME: 202010242323
ISSUE DATE / TIME: 202010251122
Unit Type and Rh: 5100
Unit Type and Rh: 5100
Unit Type and Rh: 5100

## 2019-03-17 LAB — FSH/LH
FSH: 2.2 m[IU]/mL
LH: 2.5 m[IU]/mL

## 2019-03-17 LAB — ESTRADIOL: Estradiol: 304 pg/mL

## 2019-03-17 LAB — PROLACTIN: Prolactin: 83.1 ng/mL — ABNORMAL HIGH (ref 4.8–23.3)

## 2019-04-08 ENCOUNTER — Telehealth: Payer: Self-pay | Admitting: Obstetrics and Gynecology

## 2019-04-08 NOTE — Telephone Encounter (Signed)
December is ok.

## 2019-04-08 NOTE — Telephone Encounter (Signed)
Patient is calling to schedule ER follow up for excessive bleeding. First available appointment isn't until December. Patient reports bleeding has stopped. Please advise Work in

## 2019-04-09 NOTE — Telephone Encounter (Signed)
Patient is schedule 04/27/19

## 2019-04-13 ENCOUNTER — Other Ambulatory Visit: Payer: Self-pay | Admitting: Obstetrics and Gynecology

## 2019-04-13 DIAGNOSIS — N939 Abnormal uterine and vaginal bleeding, unspecified: Secondary | ICD-10-CM

## 2019-04-13 MED ORDER — MEDROXYPROGESTERONE ACETATE 10 MG PO TABS: 10.0000 mg | ORAL_TABLET | Freq: Every day | ORAL | 0 refills | Status: DC

## 2019-04-13 NOTE — Telephone Encounter (Signed)
Sent rx for 10mg  provera daily. Only 30 day supply, no additional refills without an office visit. Please advise patient.

## 2019-04-13 NOTE — Telephone Encounter (Signed)
Please advise 

## 2019-04-13 NOTE — Telephone Encounter (Signed)
Pt called triage line stating she is having bleeding again, had to have blood transfusion(didn't states when) She was wondering if she could get a refill on the medication. She has an appointment 12/7. CB# [phone]

## 2019-04-13 NOTE — Telephone Encounter (Signed)
Called and discussed with patient. Advised to take progesterone 1 tab a day. She is having bleeding which started once the progesterone taper ended. Advised to go to the ER if she has heavy bleeding again or feels dizzy, lightheaded, SOB etc.  Reviewed bleeding precautions. Advised to go sooner than later if bleeding is heavy because she was very anemic even before she left the hospital. No recent US or EMB results that I can see. Staebler you may want to move her appointment up or call her if you can. I'm not familiar with this situation.

## 2019-04-13 NOTE — Telephone Encounter (Signed)
Patient is calling with questions about prescription. Please advise

## 2019-04-13 NOTE — Telephone Encounter (Signed)
Pt aware of medication being sent to pharmacy

## 2019-04-13 NOTE — Telephone Encounter (Signed)
Please call pt. Sheila Moore pt

## 2019-04-27 ENCOUNTER — Other Ambulatory Visit: Payer: Self-pay

## 2019-04-27 ENCOUNTER — Telehealth: Payer: Self-pay | Admitting: Obstetrics and Gynecology

## 2019-04-27 ENCOUNTER — Ambulatory Visit (INDEPENDENT_AMBULATORY_CARE_PROVIDER_SITE_OTHER): Payer: Medicare Other | Admitting: Obstetrics and Gynecology

## 2019-04-27 ENCOUNTER — Other Ambulatory Visit (HOSPITAL_COMMUNITY)
Admission: RE | Admit: 2019-04-27 | Discharge: 2019-04-27 | Disposition: A | Discharge status: Home / Self Care | Payer: Medicare Other | Source: Ambulatory Visit | Attending: Obstetrics and Gynecology | Admitting: Obstetrics and Gynecology

## 2019-04-27 ENCOUNTER — Encounter: Payer: Self-pay | Admitting: Obstetrics and Gynecology

## 2019-04-27 VITALS — BP 136/86 | HR 118 | Ht 65.0 in | Wt 180.0 lb

## 2019-04-27 DIAGNOSIS — Z124 Encounter for screening for malignant neoplasm of cervix: Secondary | ICD-10-CM | POA: Diagnosis not present

## 2019-04-27 DIAGNOSIS — N939 Abnormal uterine and vaginal bleeding, unspecified: Secondary | ICD-10-CM

## 2019-04-27 DIAGNOSIS — Z1151 Encounter for screening for human papillomavirus (HPV): Secondary | ICD-10-CM | POA: Insufficient documentation

## 2019-04-27 DIAGNOSIS — E221 Hyperprolactinemia: Secondary | ICD-10-CM | POA: Diagnosis not present

## 2019-04-27 DIAGNOSIS — E237 Disorder of pituitary gland, unspecified: Secondary | ICD-10-CM

## 2019-04-27 NOTE — Progress Notes (Signed)
Gynecology Abnormal Uterine Bleeding Initial Evaluation   Chief Complaint:  Chief Complaint  Patient presents with  . Follow-up    irregular bleeding    History of Present Illness:    Patient is a 43 y.o. G2P1011 with a long standing history of menorrhagia.  She has a history of prior admission for blood transfusion in 2018.  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  She has headaches as well right temporal seizures.  Is on Keppra and carbamazepine for her seizures.   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               Her most recent bleeding episode started about in early October, it increased in flow prompting ER presentation on 03/14/2019. She reports going through a box of super tampons in the past 48-hrs and was doubling up with pads as well. She was noted to have a Hgb of 4.0 on presentation. She reports irregular menses at time heavy for the better part of a year now, and as previously mentioned a prior admission in 2018 for blood transfusion.  She reports one episode of nipple discharge, described as black that was worked up an negative.  She also reports hot flashes which have become more pronounced in the past year.                Obstetric history is notable for one prior vaginal delivery, followed by C-section and tubal ligation 20 years ago.  Prior uterine imaging from 2018 and 2019 reviewed and normal.  2018 imaging made mention of a possible focal endometrial abnormality which was not apparent on imaging in 2019  Patient was admitted for blood transfusion and started on po provera with good improvement in symptoms.  Work up revealed elevated prolactin with decreased FSH/LH consistent with mass likely causing compressive effect and acting similar to a stalk resection.     Review of Systems: Review of  Systems  Constitutional: Negative.   Gastrointestinal: Negative.   Genitourinary: Negative.   Neurological: Positive for headaches.  Endo/Heme/Allergies: Does not bruise/bleed easily.    Past Medical History:  Past Medical History:  Diagnosis Date  . Blood transfusion without reported diagnosis   . Brain tumor (benign) ([place])   . Seizures ([place])     Past Surgical History:  Past Surgical History:  Procedure Laterality Date  . BRAIN SURGERY  2010   tumor removed    Obstetric History: G2P0011  Family History:  Family History  Problem Relation Age of Onset  . Hypertension Mother   . COPD Mother     Social History:  Social History   Socioeconomic History  . Marital status: Legally Separated    Spouse name: Not on file  . Number of children: Not on file  . Years of education: Not on file  . Highest education level: Not on file  Occupational History  . Not on file  Tobacco Use  . Smoking status: Never Smoker  . Smokeless tobacco: Never Used  Substance and Sexual Activity  . Alcohol use: No  . Drug use: No  . Sexual activity: Not Currently    Birth control/protection: None  Other Topics Concern  . Not on file  Social History Narrative  . Not on file   Social Determinants of Health   Financial Resource Strain:   .  Difficulty of Paying Living Expenses: Not on file  Food Insecurity:   . Worried About Charity fundraiser in the Last Year: Not on file  . Ran Out of Food in the Last Year: Not on file  Transportation Needs:   . Lack of Transportation (Medical): Not on file  . Lack of Transportation (Non-Medical): Not on file  Physical Activity:   . Days of Exercise per Week: Not on file  . Minutes of Exercise per Session: Not on file  Stress:   . Feeling of Stress : Not on file  Social Connections:   . Frequency of Communication with Friends and Family: Not on file  . Frequency of Social Gatherings with Friends and Family: Not on file  . Attends Religious  Services: Not on file  . Active Member of Clubs or Organizations: Not on file  . Attends Archivist Meetings: Not on file  . Marital Status: Not on file  Intimate Partner Violence:   . Fear of Current or Ex-Partner: Not on file  . Emotionally Abused: Not on file  . Physically Abused: Not on file  . Sexually Abused: Not on file    Allergies:  No Known Allergies  Medications: Prior to Admission medications   Medication Sig Start Date End Date Taking? Authorizing Provider  carbamazepine (CARBATROL) 300 MG 12 hr capsule Take 600 mg by mouth 2 (two) times daily. 09/18/18  Yes [provider]  Fe Fum-Fe Poly-Vit C-Lactobac (FUSION) 65-65-25-30 MG CAPS Take 1 tablet by mouth daily. 03/15/19  Yes Malachy Mood, MD  levETIRAcetam (KEPPRA) 500 MG tablet Take 750-1,000 mg by mouth 2 (two) times daily. 09/18/18  Yes [provider]  medroxyPROGESTERone (PROVERA) 10 MG tablet Take 2 tablets (20 mg total) by mouth daily. 04/28/19   Malachy Mood, MD  Vitamin D, Ergocalciferol, (DRISDOL) 50000 units CAPS capsule Take 50,000 Units by mouth every Monday. 06/08/15   [provider]    Physical Exam Blood pressure 136/86, pulse (!) 118, height 5\' 5"  (1.651 m), weight 180 lb (81.6 kg).  No LMP recorded.  General: NAD, well nourished, appears stated age 4: normocephalic, anicteric Pulmonary: No increased work of breathing Genitourinary:  External: Normal external female genitalia.  Normal urethral meatus, normal Bartholin's and Skene's glands.    Vagina: Normal vaginal mucosa, no evidence of prolapse.    Cervix: Grossly normal in appearance, no bleeding  Uterus: Non-enlarged, mobile, normal contour.  No CMT  Adnexa: ovaries non-enlarged, no adnexal masses  Rectal: deferred  Lymphatic: no evidence of inguinal lymphadenopathy Extremities: no edema, erythema, or tenderness Neurologic: Grossly intact Psychiatric: mood appropriate, affect full  Female  chaperone present for pelvic portions of the physical exam   ENDOMETRIAL BIOPSY     The indications for endometrial biopsy were reviewed.   Risks of the biopsy including cramping, bleeding, infection, uterine perforation, inadequate specimen and need for additional procedures  were discussed. The patient states she understands and agrees to undergo procedure today. Consent was signed. Time out was performed. Urine HCG was negative. A Graves speculum was placed and the cervix was brought into view.  The cervix was prepped with Betadine. A single-toothed tenaculum was  placed on the anterior lip of the cervix for traction. A 3 mm pipelle was introduced through the cervix into the endometrial cavity without difficulty to a depth of 10cm, and a large amount of old blood was evacuated, minimal endometrial tissue was obtained, the resulting specime sent to pathology. The instruments were  removed from the patient's vagina. Minimal bleeding from the cervix was noted. The patient tolerated the procedure well. Routine post-procedure instructions were given to the patient.  She will be contacted by phone one results become available.      Assessment: 43 y.o. 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  Discussed risks and benefits of each method.   Final management decision will hinge on results of patient's work up and whether an underlying etiology for the patients bleeding symptoms can be discerned.  We will conduct a basic work up examining using the PALM-COIEN classification system.  In the meantime the patient opts to continue provera while we await results of her ultrasound and labs.  The role of unopposed estrogen in the development of endometrial hyperplasia or carcinoma is discussed.  The risk of endometrial hyperplasia is linearly correlated with increasing BMI given the production of estrone by adipose tissue. Printed patient education handouts were given to the patient to review at home.  Bleeding precautions reviewed.  - pand endometrial bx -nNeurologic referral given elevated prolactin and depressed FSH/LH levels suspect some component of mass effect on pituitary acting like stalk resection - Continue provera with mirena IUD, novasure, hysterectomy discussed as long term management options  2) Return if symptoms worsen or fail to improve.   Malachy Mood, MD, [facility], [place]

## 2019-04-27 NOTE — Telephone Encounter (Signed)
Patient was seen today and is wanting to know if she still needs to take her Prevera? If so she says she needs a refill. Please advise

## 2019-04-27 NOTE — Telephone Encounter (Signed)
advise

## 2019-04-27 NOTE — Telephone Encounter (Signed)
Yes off the provera she is going to start bleeding again.  So until we have results back from biopsy and come up with long term management plan continue provera

## 2019-04-28 ENCOUNTER — Other Ambulatory Visit: Payer: Self-pay | Admitting: Obstetrics and Gynecology

## 2019-04-28 MED ORDER — MEDROXYPROGESTERONE ACETATE 10 MG PO TABS: 20.0000 mg | ORAL_TABLET | Freq: Every day | ORAL | 2 refills | Status: DC

## 2019-04-28 NOTE — Telephone Encounter (Signed)
Rx sent 

## 2019-04-28 NOTE — Telephone Encounter (Signed)
Please send in Rx for Provera. I will call Sheila Moore and let her knowsta

## 2019-04-28 NOTE — Telephone Encounter (Signed)
Pt aware.

## 2019-04-29 LAB — CYTOLOGY - PAP
Comment: NEGATIVE
Diagnosis: NEGATIVE
High risk HPV: NEGATIVE

## 2019-04-29 LAB — SURGICAL PATHOLOGY

## 2019-05-07 ENCOUNTER — Telehealth: Payer: Self-pay | Admitting: Obstetrics and Gynecology

## 2019-05-07 NOTE — Telephone Encounter (Signed)
Patient is calling due to missed call. Please advise °

## 2019-05-08 ENCOUNTER — Telehealth: Payer: Self-pay | Admitting: Obstetrics and Gynecology

## 2019-05-08 ENCOUNTER — Other Ambulatory Visit: Payer: Self-pay | Admitting: Obstetrics and Gynecology

## 2019-05-08 MED ORDER — MEDROXYPROGESTERONE ACETATE 10 MG PO TABS: 20.0000 mg | ORAL_TABLET | Freq: Every day | ORAL | 1 refills | Status: DC

## 2019-05-08 NOTE — Telephone Encounter (Signed)
Patient scheduled 1/11 with AMS for Mirena insertion at [facility].

## 2019-05-08 NOTE — Telephone Encounter (Signed)
-----   Message from Malachy Mood, MD sent at 05/08/2019  1:36 PM EST ----- Regarding: IUD insertion IUD insertion in 2 weeks

## 2019-05-19 NOTE — Telephone Encounter (Signed)
Noted. Will order to arrive by apt date/time. 

## 2019-06-01 ENCOUNTER — Ambulatory Visit (INDEPENDENT_AMBULATORY_CARE_PROVIDER_SITE_OTHER): Payer: Medicare Other | Admitting: Obstetrics and Gynecology

## 2019-06-01 ENCOUNTER — Encounter: Payer: Self-pay | Admitting: Obstetrics and Gynecology

## 2019-06-01 ENCOUNTER — Other Ambulatory Visit: Payer: Self-pay

## 2019-06-01 VITALS — BP 124/84 | HR 111 | Ht 65.0 in | Wt 183.0 lb

## 2019-06-01 DIAGNOSIS — R300 Dysuria: Secondary | ICD-10-CM

## 2019-06-01 DIAGNOSIS — Z30430 Encounter for insertion of intrauterine contraceptive device: Secondary | ICD-10-CM | POA: Diagnosis not present

## 2019-06-01 MED ORDER — NITROFURANTOIN MONOHYD MACRO 100 MG PO CAPS: 100.0000 mg | ORAL_CAPSULE | Freq: Two times a day (BID) | ORAL | 0 refills | Status: AC

## 2019-06-01 NOTE — Progress Notes (Signed)
-    GYNECOLOGY OFFICE PROCEDURE NOTE  ROBBEN FANFAN is a 44 y.o. G2P0011 here for a Mirena IUD insertion. No GYN concerns.  Last pap smear was on 04/27/2019 and was normal.  The patient is currently using provera for contraception and her LMP is Patient's last menstrual period was 06/01/2019 (exact date)..  The indication for her IUD is cycle control.  Patient does report dysuria with onset in the past 24-hrs.  IUD Insertion Procedure Note Patient identified, informed consent performed, consent signed.   Discussed risks of irregular bleeding, cramping, infection, malpositioning, expulsion or uterine perforation of the IUD (1:1000 placements)  which may require further procedure such as laparoscopy.  IUD while effective at preventing pregnancy do not prevent transmission of sexually transmitted diseases and use of barrier methods for this purpose was discussed. Time out was performed.  Urine pregnancy test negative.  Speculum placed in the vagina.  Cervix visualized.  Cleaned with Betadine x 2.  Grasped anteriorly with a single tooth tenaculum.  Uterus sounded to 9 cm. IUD placed per manufacturer's recommendations.  Strings trimmed to 3 cm. Tenaculum was removed, good hemostasis noted.  Patient tolerated procedure well.   Patient was given post-procedure instructions.  She was advised to have backup contraception for one week.  Patient was also asked to check IUD strings periodically and follow up in 6 weeks for IUD check.  Reports dysuria in past 24-hrs, urinated prior to IUD insertion.  Rx macrobid to call if symptoms fail to improve.  Neurology follow up 06/23/2019, discussed potential of bromocriptine vs cabergoline.   Malachy Mood, MD, [facility], [place]

## 2019-06-23 DIAGNOSIS — R519 Headache, unspecified: Secondary | ICD-10-CM | POA: Diagnosis not present

## 2019-07-13 ENCOUNTER — Telehealth: Payer: Self-pay

## 2019-07-13 ENCOUNTER — Other Ambulatory Visit: Payer: Self-pay

## 2019-07-13 ENCOUNTER — Other Ambulatory Visit: Payer: Self-pay | Admitting: Obstetrics & Gynecology

## 2019-07-13 ENCOUNTER — Ambulatory Visit (INDEPENDENT_AMBULATORY_CARE_PROVIDER_SITE_OTHER): Payer: Medicare Other

## 2019-07-13 DIAGNOSIS — D251 Intramural leiomyoma of uterus: Secondary | ICD-10-CM

## 2019-07-13 DIAGNOSIS — N938 Other specified abnormal uterine and vaginal bleeding: Secondary | ICD-10-CM

## 2019-07-13 DIAGNOSIS — D252 Subserosal leiomyoma of uterus: Secondary | ICD-10-CM | POA: Diagnosis not present

## 2019-07-13 DIAGNOSIS — N939 Abnormal uterine and vaginal bleeding, unspecified: Secondary | ICD-10-CM

## 2019-07-13 NOTE — Telephone Encounter (Signed)
Spoke w/patient to advise order for u/s received. Patient will come for u/s today at 1pm and is aware message sent to AMS, out of office until tomorrow, to inquire when he can see her as his schedule is full.

## 2019-07-13 NOTE — Telephone Encounter (Signed)
Spoke w/patient. She states she did not go to ED as she had to wait for someone to come watch her son and by that time, the bleeding had subsided. However, she experienced more bleeding last night (soaked/changed pad 2-3 times last night). Reports she has had to have a blood transfusion in the past d/t excessive blood loss. She would like to see AMS as she had discussed other options (Ablation). Advised u/s may be needed to verify IUD location.

## 2019-07-13 NOTE — Telephone Encounter (Signed)
Patient called after hours on call service to report she had IUD placed about 6 wks ago. She had spotting after the placement but is experiencing "bleeding excessively" this weekend. Reports soaking and having to change pad 3-4 times during the night. Feels drained, but no other symptoms. On Call service advised patient to go to ED. No records fornd in chart.

## 2019-07-14 ENCOUNTER — Other Ambulatory Visit: Payer: Self-pay | Admitting: Obstetrics and Gynecology

## 2019-07-14 ENCOUNTER — Other Ambulatory Visit: Payer: Medicare Other

## 2019-07-14 ENCOUNTER — Telehealth: Payer: Self-pay | Admitting: Obstetrics and Gynecology

## 2019-07-14 DIAGNOSIS — T8332XA Displacement of intrauterine contraceptive device, initial encounter: Secondary | ICD-10-CM

## 2019-07-14 DIAGNOSIS — N939 Abnormal uterine and vaginal bleeding, unspecified: Secondary | ICD-10-CM

## 2019-07-14 MED ORDER — MEDROXYPROGESTERONE ACETATE 10 MG PO TABS: 20.0000 mg | ORAL_TABLET | Freq: Every day | ORAL | 1 refills | Status: DC

## 2019-07-14 NOTE — Telephone Encounter (Signed)
Scheduled

## 2019-07-14 NOTE — Progress Notes (Signed)
[place] reviewed no evidence of intrauterine device.  Start provera given current bleeding.  Plain film X-ray to rule out expulsion.  At this point considering hysterectomy

## 2019-07-14 NOTE — Telephone Encounter (Signed)
-----   Message from Malachy Mood, MD sent at 07/14/2019 12:39 PM EST ----- Regarding: Labs Labs visit today for [place], MD, [place], [facility] 07/14/2019, 12:40 PM

## 2019-07-15 ENCOUNTER — Telehealth: Payer: Self-pay

## 2019-07-15 ENCOUNTER — Other Ambulatory Visit: Payer: Self-pay | Admitting: Obstetrics and Gynecology

## 2019-07-15 ENCOUNTER — Telehealth: Payer: Self-pay | Admitting: Obstetrics and Gynecology

## 2019-07-15 LAB — CBC
Hematocrit: 20.9 % — ABNORMAL LOW (ref 34.0–46.6)
Hemoglobin: 5.7 g/dL — CL (ref 11.1–15.9)
MCH: 17.2 pg — ABNORMAL LOW (ref 26.6–33.0)
MCHC: 27.3 g/dL — ABNORMAL LOW (ref 31.5–35.7)
MCV: 63 fL — ABNORMAL LOW (ref 79–97)
Platelets: 327 10*3/uL (ref 150–450)
RBC: 3.31 x10E6/uL — ABNORMAL LOW (ref 3.77–5.28)
RDW: 18.8 % — ABNORMAL HIGH (ref 11.7–15.4)
WBC: 9.5 10*3/uL (ref 3.4–10.8)

## 2019-07-15 NOTE — Telephone Encounter (Signed)
Called pt to schedule surgery with Dr Georgianne Fick  DOS 09/10/19  H&P 4/12 @ 8:10am  Covid testing 4/20 8:30-10, Medical [address] Drive up and wear mask. Adv pt to quar after test until DOS.  Pt's neurologist Merlene Morse, [facility] [place],  [address] [phone] Joylene Igo)  Adv pt to contact Dr. Inocente Salles to obtain medical clearance and notify of appt scheduled.

## 2019-07-15 NOTE — Telephone Encounter (Signed)
Stephanie from [facility] called after hour nurse 07/15/19 at 2:28am reporting a critical lab result. After hour nurse spoke c SDJ.

## 2019-07-15 NOTE — Telephone Encounter (Signed)
-----   Message from Malachy Mood, MD sent at 07/14/2019 12:41 PM EST ----- Regarding: Surgery Surgery Booking Request Patient Full Name:  Sheila Moore  MRN: [id number]  DOB: 10-26-1975  Surgeon: Malachy Mood, MD  Requested Surgery Date and Time: 2-4 weeks Primary Diagnosis AND Code: AUB N93.9 Secondary Diagnosis and Code:  Surgical Procedure: TLH/BS L&D Notification: No Admission Status: observation Length of Surgery: 2hrs Special Case Needs: No H&P: Yes Phone Interview???:  No Interpreter: No Language:  Medical Clearance:  Yes (neurology) Special Scheduling Instructions: none Any known health/anesthesia issues, diabetes, sleep apnea, latex allergy, defibrillator/pacemaker?: No Acuity: P2   (P1 highest, P2 delay may cause harm, P3 low, elective gyn, P4 lowest)

## 2019-07-15 NOTE — Progress Notes (Signed)
Spoke with patient regarding her CBC results with Hgb of 5.7.  The patient states bleeding has subsided now on second dose of provera.  She is asymptomatic and has been anemic for sometime.  I discussed pros and cons of blood transfusion and at present patient opts to follow expectantly.  Should she become symptomatic I discussed calling to arrange direct admission or outpatient transfusion

## 2019-07-30 NOTE — Telephone Encounter (Signed)
Received fax for medical clearance from patient's neurologist. Sending to scan.  Faxed to Pre-admit.

## 2019-08-28 ENCOUNTER — Other Ambulatory Visit: Payer: Medicare Other

## 2019-08-31 ENCOUNTER — Encounter: Payer: Self-pay | Admitting: Obstetrics and Gynecology

## 2019-08-31 ENCOUNTER — Other Ambulatory Visit: Payer: Self-pay

## 2019-08-31 ENCOUNTER — Ambulatory Visit (INDEPENDENT_AMBULATORY_CARE_PROVIDER_SITE_OTHER): Payer: Medicare Other | Admitting: Obstetrics and Gynecology

## 2019-08-31 VITALS — BP 128/84 | HR 111 | Ht 65.0 in | Wt 183.0 lb

## 2019-08-31 DIAGNOSIS — Z01818 Encounter for other preprocedural examination: Secondary | ICD-10-CM

## 2019-08-31 DIAGNOSIS — N939 Abnormal uterine and vaginal bleeding, unspecified: Secondary | ICD-10-CM | POA: Diagnosis not present

## 2019-08-31 NOTE — Progress Notes (Signed)
Obstetrics & Gynecology Surgery H&P    Chief Complaint: Scheduled Surgery   History of Present Illness: Patient is a 44 y.o. [id number] presenting for scheduled TLH, BS, cystoscopy, for the treatment or further evaluation of AUB.   Prior Treatments prior to proceeding with surgery include: hormonal management, attempt at [place] IUD  Preoperative Pap:04/27/2019 Results: NIL HPV negative  Preoperative Endometrial biopsy: 04/27/2019 Findings: pathology benign inactive endometrium Preoperative Ultrasound: 07/13/2019 Findings: normal two small fibroids   Review of Systems:10 point review of systems  Past Medical History:  Patient Active Problem List   Diagnosis Date Noted  . Menorrhagia 05/23/2016  . Anemia 05/23/2016  . Nipple discharge 11/06/2012    Past Surgical History:  Past Surgical History:  Procedure Laterality Date  . BRAIN SURGERY  2010   tumor removed    Family History:  Family History  Problem Relation Age of Onset  . Hypertension Mother   . COPD Mother     Social History:  Social History   Socioeconomic History  . Marital status: Legally Separated    Spouse name: Not on file  . Number of children: Not on file  . Years of education: Not on file  . Highest education level: Not on file  Occupational History  . Not on file  Tobacco Use  . Smoking status: Never Smoker  . Smokeless tobacco: Never Used  Substance and Sexual Activity  . Alcohol use: No  . Drug use: No  . Sexual activity: Yes    Birth control/protection: Injection  Other Topics Concern  . Not on file  Social History Narrative  . Not on file   Social Determinants of Health   Financial Resource Strain:   . Difficulty of Paying Living Expenses:   Food Insecurity:   . Worried About Charity fundraiser in the Last Year:   . Arboriculturist in the Last Year:   Transportation Needs:   . Film/video editor (Medical):   Marland Kitchen Lack of Transportation (Non-Medical):   Physical Activity:   .  Days of Exercise per Week:   . Minutes of Exercise per Session:   Stress:   . Feeling of Stress :   Social Connections:   . Frequency of Communication with Friends and Family:   . Frequency of Social Gatherings with Friends and Family:   . Attends Religious Services:   . Active Member of Clubs or Organizations:   . Attends Archivist Meetings:   Marland Kitchen Marital Status:   Intimate Partner Violence:   . Fear of Current or Ex-Partner:   . Emotionally Abused:   Marland Kitchen Physically Abused:   . Sexually Abused:     Allergies:  No Known Allergies  Medications: Prior to Admission medications   Medication Sig Start Date End Date Taking? Authorizing Provider  carbamazepine (CARBATROL) 300 MG 12 hr capsule Take 600 mg by mouth 2 (two) times daily. 09/18/18  Yes [provider]  Fe Fum-Fe Poly-Vit C-Lactobac (FUSION) 65-65-25-30 MG CAPS Take 1 tablet by mouth daily. 03/15/19  Yes Malachy Mood, MD  ibuprofen (ADVIL) 200 MG tablet Take 600 mg by mouth every 8 (eight) hours as needed (pain.).   Yes [provider]  levETIRAcetam (KEPPRA) 500 MG tablet Take 750-1,000 mg by mouth See admin instructions. Take 2 tablets (1000 mg) by mouth in the morning & take 1.5 tablets (750 mg) by mouth at night. 09/18/18  Yes [provider]  medroxyPROGESTERone (PROVERA) 10 MG tablet Take 2  tablets (20 mg total) by mouth daily. Patient taking differently: Take 10 mg by mouth daily.  07/14/19  Yes Malachy Mood, MD    Physical Exam Vitals: Blood pressure 128/84, pulse (!) 111, height 5\' 5"  (1.651 m), weight 183 lb (83 kg). General: NAD HEENT: normocephalic, anicteric Pulmonary: No increased work of breathing, CTAB Cardiovascular: RRR, distal pulses 2+ Abdomen: soft, non-tender, non-distended Genitourinary: deferred Extremities: no edema, erythema, or tenderness Neurologic: Grossly intact Psychiatric: mood appropriate, affect full  Imaging No results found.  Assessment: 44  y.o. G2P0011 presenting for scheduled TLH, BS, cystoscopy  Plan: 1) Patient opts for definitive surgical management via hysterectomy. The risks of surgery were discussed in detail with the patient including but not limited to: bleeding which may require transfusion or reoperation; infection which may require antibiotics; injury to bowel, bladder, ureters or other surrounding organs (With a literature reported rate of urinary tract injury of 1% quoted); need for additional procedures including laparotomy; thromboembolic phenomenon, incisional problems and other postoperative/anesthesia complications.  Patient was also advised that recovery procedure generally involves an overnight stay; and the  expected recovery time after a hysterectomy being in the range of 6-8 weeks.  Likelihood of success in alleviating the patient's symptoms was discussed.  While definitive in regards to issues with menstural bleeding, pelvic pain if present preoperatively may continue and in fact worsen postoperatively.  She is aware that the procedure will render her unable to pursue childbearing in the future.   She was told that she will be contacted by our surgical scheduler regarding the time and date of her surgery; routine preoperative instructions of having nothing to eat or drink after midnight on the day prior to surgery and also coming to the hospital 1.5 hours prior to her time of surgery were also emphasized.  She was told she may be called for a preoperative appointment about a week prior to surgery and will be given further preoperative instructions at that visit.  Routine postoperative instructions will be reviewed with the patient and her family in detail after surgery. Printed patient education handouts about the procedure was given to the patient to review at home.   2) Routine postoperative instructions were reviewed with the patient and her family in detail today including the expected length of recovery and likely  postoperative course.  The patient concurred with the proposed plan, giving informed written consent for the surgery today.  Patient instructed on the importance of being NPO after midnight prior to her procedure.  If warranted preoperative prophylactic antibiotics and SCDs ordered on call to the OR to meet SCIP guidelines and adhere to recommendation laid forth in [place] Number 104 May 2009  "Antibiotic Prophylaxis for Gynecologic Procedures".     Malachy Mood, MD, [facility] 08/31/2019, 8:40 AM

## 2019-09-01 ENCOUNTER — Other Ambulatory Visit: Payer: Self-pay

## 2019-09-01 ENCOUNTER — Encounter
Admission: RE | Admit: 2019-09-01 | Discharge: 2019-09-01 | Disposition: A | Discharge status: Home / Self Care | Payer: Medicare Other | Source: Ambulatory Visit | Attending: Obstetrics and Gynecology | Admitting: Obstetrics and Gynecology

## 2019-09-01 NOTE — Patient Instructions (Signed)
Your procedure is scheduled on: 09/10/19 Report to [facility]. To find out your arrival time please call [phone] between 1PM - 3PM on 09/09/19.  Remember: Instructions that are not followed completely may result in serious medical risk, up to and including death, or upon the discretion of your surgeon and anesthesiologist your surgery may need to be rescheduled.     _X__ 1. Do not eat food after midnight the night before your procedure.                 No gum chewing or hard candies. You may drink clear liquids up to 2 hours                 before you are scheduled to arrive for your surgery- DO not drink clear                 liquids within 2 hours of the start of your surgery.                 Clear Liquids include:  water, apple juice without pulp, clear carbohydrate                 drink such as Clearfast or Gatorade, Black Coffee or Tea (Do not add                 anything to coffee or tea). Diabetics water only  __X__2.  On the morning of surgery brush your teeth with toothpaste and water, you                 may rinse your mouth with mouthwash if you wish.  Do not swallow any              toothpaste of mouthwash.     _X__ 3.  No Alcohol for 24 hours before or after surgery.   _X__ 4.  Do Not Smoke or use e-cigarettes For 24 Hours Prior to Your Surgery.                 Do not use any chewable tobacco products for at least 6 hours prior to                 surgery.  ____  5.  Bring all medications with you on the day of surgery if instructed.   __X__  6.  Notify your doctor if there is any change in your medical condition      (cold, fever, infections).     Do not wear jewelry, make-up, hairpins, clips or nail polish. Do not wear lotions, powders, or perfumes.  Do not shave 48 hours prior to surgery. Men may shave face and neck. Do not bring valuables to the hospital.    [facility] is not responsible for any belongings or  valuables.  Contacts, dentures/partials or body piercings may not be worn into surgery. Bring a case for your contacts, glasses or hearing aids, a denture cup will be supplied. Leave your suitcase in the car. After surgery it may be brought to your room. For patients admitted to the hospital, discharge time is determined by your treatment team.   Patients discharged the day of surgery will not be allowed to drive home.   Please read over the following fact sheets that you were given:   MRSA Information  __X__ Take these medicines the morning of surgery with A SIP OF WATER:  1. carbamazepine (CARBATROL) 600 MG 12 hr capsule  2. levETIRAcetam (KEPPRA)1000 MG tablet  3.   4.  5.  6.  ____ Fleet Enema (as directed)   __X__ Use CHG Soap/SAGE wipes as directed  ____ Use inhalers on the day of surgery  ____ Stop metformin/Janumet/Farxiga 2 days prior to surgery    ____ Take 1/2 of usual insulin dose the night before surgery. No insulin the morning          of surgery.   ____ Stop Blood Thinners Coumadin/Plavix/Xarelto/Pleta/Pradaxa/Eliquis/Effient/Aspirin  on   Or contact your Surgeon, Cardiologist or Medical Doctor regarding  ability to stop your blood thinners  __X__ Stop Anti-inflammatories 7 days before surgery such as Advil, Ibuprofen, Motrin,  BC or Goodies Powder, Naprosyn, Naproxen, Aleve, Aspirin    __X__ Stop all herbal supplements, fish oil or vitamin E until after surgery.    ____ Bring C-Pap to the hospital.     Ensure Pre Surgery drink is to be completed 2 hours prior to arriving the day of surgery.

## 2019-09-08 ENCOUNTER — Other Ambulatory Visit
Admission: RE | Admit: 2019-09-08 | Discharge: 2019-09-08 | Disposition: A | Discharge status: Home / Self Care | Payer: Medicare Other | Source: Ambulatory Visit | Attending: Obstetrics and Gynecology | Admitting: Obstetrics and Gynecology

## 2019-09-08 ENCOUNTER — Other Ambulatory Visit: Payer: Self-pay

## 2019-09-08 ENCOUNTER — Observation Stay
Admission: AD | Admit: 2019-09-08 | Discharge: 2019-09-09 | Disposition: A | Discharge status: Home / Self Care | Payer: Medicare Other | Source: Ambulatory Visit | Attending: Obstetrics and Gynecology | Admitting: Obstetrics and Gynecology

## 2019-09-08 DIAGNOSIS — Z79899 Other long term (current) drug therapy: Secondary | ICD-10-CM | POA: Insufficient documentation

## 2019-09-08 DIAGNOSIS — N920 Excessive and frequent menstruation with regular cycle: Secondary | ICD-10-CM | POA: Diagnosis not present

## 2019-09-08 DIAGNOSIS — D62 Acute posthemorrhagic anemia: Secondary | ICD-10-CM | POA: Diagnosis not present

## 2019-09-08 DIAGNOSIS — Z793 Long term (current) use of hormonal contraceptives: Secondary | ICD-10-CM | POA: Diagnosis not present

## 2019-09-08 DIAGNOSIS — Z20822 Contact with and (suspected) exposure to covid-19: Secondary | ICD-10-CM | POA: Diagnosis not present

## 2019-09-08 LAB — TYPE AND SCREEN
ABO/RH(D): O POS
Antibody Screen: NEGATIVE
Unit division: 0
Unit division: 0
Unit division: 0
Unit division: 0

## 2019-09-08 LAB — CBC
HCT: 24.3 % — ABNORMAL LOW (ref 36.0–46.0)
Hemoglobin: 6 g/dL — ABNORMAL LOW (ref 12.0–15.0)
MCH: 15.5 pg — ABNORMAL LOW (ref 26.0–34.0)
MCHC: 24.7 g/dL — ABNORMAL LOW (ref 30.0–36.0)
MCV: 62.8 fL — ABNORMAL LOW (ref 80.0–100.0)
Platelets: 410 10*3/uL — ABNORMAL HIGH (ref 150–400)
RBC: 3.87 MIL/uL (ref 3.87–5.11)
RDW: 19.9 % — ABNORMAL HIGH (ref 11.5–15.5)
WBC: 6.6 10*3/uL (ref 4.0–10.5)
nRBC: 0 % (ref 0.0–0.2)

## 2019-09-08 LAB — BPAM RBC
Blood Product Expiration Date: 202105072359
Blood Product Expiration Date: 202105202359
Blood Product Expiration Date: 202105202359
Blood Product Expiration Date: 202105222359
ISSUE DATE / TIME: 202104200531
Unit Type and Rh: 5100
Unit Type and Rh: 5100
Unit Type and Rh: 5100
Unit Type and Rh: 5100

## 2019-09-08 LAB — BASIC METABOLIC PANEL
Anion gap: 9 (ref 5–15)
BUN: 9 mg/dL (ref 6–20)
CO2: 24 mmol/L (ref 22–32)
Calcium: 9.1 mg/dL (ref 8.9–10.3)
Chloride: 106 mmol/L (ref 98–111)
Creatinine, Ser: 0.81 mg/dL (ref 0.44–1.00)
GFR calc Af Amer: >60 mL/min (ref >60)
GFR calc non Af Amer: >60 mL/min (ref >60)
Glucose, Bld: 121 mg/dL — ABNORMAL HIGH (ref 70–99)
Potassium: 3.7 mmol/L (ref 3.5–5.1)
Sodium: 139 mmol/L (ref 135–145)

## 2019-09-08 LAB — PREPARE RBC (CROSSMATCH)

## 2019-09-08 LAB — SARS CORONAVIRUS 2 (TAT 6-24 HRS): SARS Coronavirus 2: NEGATIVE

## 2019-09-08 MED ORDER — SODIUM CHLORIDE 0.9 % IV SOLN: INTRAVENOUS | Status: DC

## 2019-09-08 MED ORDER — DIPHENHYDRAMINE HCL 25 MG PO CAPS
25.0000 mg | ORAL_CAPSULE | Freq: Once | ORAL | Status: AC
  Administered 2019-09-08: 25 mg via ORAL
  Filled 2019-09-08: qty 1

## 2019-09-08 MED ORDER — SODIUM CHLORIDE 0.9% IV SOLUTION: Freq: Once | INTRAVENOUS | Status: AC

## 2019-09-08 NOTE — H&P (Signed)
Obstetrics & Gynecology Consult H&P   Chief Complaint: Anemia  History of Present Illness: Patient is a 44 y.o. [id number] with menorrhagia to anemia scheduled for TLH, BS, cystoscopy on 09/10/2019 who on her preoperative labs was noted to have a Hgb of 6.0.  The patient has had Hgb as low as 4.0.  She has had partial response to po provera, has failed Mirena IUD (expulsion).  She currently has no bleeding and is asymptomatic.  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  Given her upcoming surgery discussion was had with the patient of admission preoperatively for packed red blood cell transfusion in order to optimize her hemodynamically prior to surgery.    Review of Systems:10 point review of systems  Past Medical History:  Patient Active Problem List   Diagnosis Date Noted  . Acute blood loss anemia 09/08/2019  . Menorrhagia 05/23/2016  . Anemia 05/23/2016  . Nipple discharge 11/06/2012    Past Surgical History:  Past Surgical History:  Procedure Laterality Date  . BRAIN SURGERY  2010   tumor removed  . CESAREAN SECTION    . MUSCLE BIOPSY     to check for maligmnant hyperthermia    Obstetric History: G2P0011  Family History:  Family History  Problem Relation Age of Onset  . Hypertension Mother   . COPD Mother     Social History:  Social History   Socioeconomic History  . Marital status: Legally Separated    Spouse name: Not on file  . Number of children: Not on file  . Years of education: Not on file  . Highest education level: Not on file  Occupational History  . Not on file  Tobacco Use  . Smoking status: Never Smoker  . Smokeless tobacco: Never Used  Substance and Sexual Activity  . Alcohol use: No  . Drug use: No  . Sexual activity: Yes    Birth control/protection: Injection  Other Topics Concern  . Not on file  Social History Narrative  . Not on file   Social Determinants of Health   Financial  Resource Strain:   . Difficulty of Paying Living Expenses:   Food Insecurity:   . Worried About Charity fundraiser in the Last Year:   . Arboriculturist in the Last Year:   Transportation Needs:   . Film/video editor (Medical):   Marland Kitchen Lack of Transportation (Non-Medical):   Physical Activity:   . Days of Exercise per Week:   . Minutes of Exercise per Session:   Stress:   . Feeling of Stress :   Social Connections:   . Frequency of Communication with Friends and Family:   . Frequency of Social Gatherings with Friends and Family:   . Attends Religious Services:   . Active Member of Clubs or Organizations:   . Attends Archivist Meetings:   Marland Kitchen Marital Status:   Intimate Partner Violence:   . Fear of Current or Ex-Partner:   . Emotionally Abused:   Marland Kitchen Physically Abused:   . Sexually Abused:     Allergies:  No Known Allergies  Medications: Prior to Admission medications   Medication Sig Start Date End Date Taking? Authorizing Provider  carbamazepine (CARBATROL) 300 MG 12 hr capsule Take 600 mg by mouth 2 (two) times daily. 09/18/18   [provider]  ibuprofen (ADVIL) 200 MG tablet Take 600 mg by mouth every 8 (eight) hours as needed (pain.).    [provider]  levETIRAcetam (KEPPRA) 500 MG tablet Take 750-1,000 mg by mouth See admin instructions. Take 2 tablets (1000 mg) by mouth in the morning & take 1.5 tablets (750 mg) by mouth at night. 09/18/18   [provider]  medroxyPROGESTERone (PROVERA) 10 MG tablet Take 2 tablets (20 mg total) by mouth daily. Patient taking differently: Take 10 mg by mouth daily.  07/14/19   Malachy Mood, MD    Physical Exam Vitals: Blood pressure 118/87, pulse 78, temperature 99 F (37.2 C), temperature source Oral, resp. rate 20, SpO2 100 %. General: NAD HEENT: normocephalic, anicteric Pulmonary: No increased work of breathing Cardiovascular: RRR, distal pulses 2+ Extremities: no edema, erythema, or  tenderness Neurologic: Grossly intact Psychiatric: mood appropriate, affect full  Labs: Results for orders placed or performed during the hospital encounter of 09/08/19 (from the past 72 hour(s))  Prepare RBC (crossmatch)     Status: None   Collection Time: 09/08/19  4:00 PM  Result Value Ref Range   Order Confirmation      ORDER PROCESSED BY BLOOD BANK Performed at [facility], [address] [place], [place] [address]   Type and screen     Status: None (Preliminary result)   Collection Time: 09/08/19  5:12 PM  Result Value Ref Range   ABO/RH(D) O POS    Antibody Screen NEG    Sample Expiration 09/11/2019,2359    Unit Number [id number]    Blood Component Type RED CELLS,LR    Unit division 00    Status of Unit ISSUED    Transfusion Status OK TO TRANSFUSE    Crossmatch Result      Compatible Performed at [facility], [address] [place], [place] [address]    Unit Number [id number]    Blood Component Type RED CELLS,LR    Unit division 00    Status of Unit ALLOCATED    Transfusion Status OK TO TRANSFUSE    Crossmatch Result Compatible     Imaging No results found.  Assessment: 44 y.o. G2P0011 with menorrhagia to anemia presenting for blood transfusion  Plan:  1) Anemia - 2 units of pRBC ordered, will recheck CBC in AM with goal Hgb of 8.0 or greater preoperatively.  Will also ensure 2 units of standby day of surgery.  2) FEN - NS at 50mL/hr, regular diet  3) Activity - as tolerated  4) DVT ppx - ambulatory  5) Disposition - anticipate discharge tomorrow  Malachy Mood, MD, [facility], [facility] 09/08/2019, 6:23 PM

## 2019-09-08 NOTE — Pre-Procedure Instructions (Signed)
Pre-Admit Testing Provider Notification Note  Provider Notified: Dr. Georgianne Fick  Notification Mode: Fax  Reason: Abnormal lab result.  Response: Fax confirmation received.   Additional Information: Placed on chart. Noted on Pre-Admit Worksheet.  Signed: Beulah Gandy, RN

## 2019-09-09 DIAGNOSIS — Z20822 Contact with and (suspected) exposure to covid-19: Secondary | ICD-10-CM | POA: Diagnosis not present

## 2019-09-09 DIAGNOSIS — Z79899 Other long term (current) drug therapy: Secondary | ICD-10-CM | POA: Diagnosis not present

## 2019-09-09 DIAGNOSIS — D62 Acute posthemorrhagic anemia: Secondary | ICD-10-CM | POA: Diagnosis not present

## 2019-09-09 LAB — CBC
HCT: 30.7 % — ABNORMAL LOW (ref 36.0–46.0)
Hemoglobin: 8.7 g/dL — ABNORMAL LOW (ref 12.0–15.0)
MCH: 18.6 pg — ABNORMAL LOW (ref 26.0–34.0)
MCHC: 28.3 g/dL — ABNORMAL LOW (ref 30.0–36.0)
MCV: 65.5 fL — ABNORMAL LOW (ref 80.0–100.0)
Platelets: 385 10*3/uL (ref 150–400)
RBC: 4.69 MIL/uL (ref 3.87–5.11)
RDW: 23.5 % — ABNORMAL HIGH (ref 11.5–15.5)
WBC: 7.9 10*3/uL (ref 4.0–10.5)
nRBC: 0 % (ref 0.0–0.2)

## 2019-09-09 LAB — TYPE AND SCREEN
ABO/RH(D): O POS
Antibody Screen: NEGATIVE
Unit division: 0
Unit division: 0

## 2019-09-09 LAB — BPAM RBC
Blood Product Expiration Date: 202105072359
Blood Product Expiration Date: 202105202359
ISSUE DATE / TIME: 202104201822
ISSUE DATE / TIME: 202104202215
Unit Type and Rh: 5100
Unit Type and Rh: 5100

## 2019-09-09 MED ORDER — LEVETIRACETAM 500 MG PO TABS
1000.0000 mg | ORAL_TABLET | Freq: Every morning | ORAL | Status: DC
  Filled 2019-09-09: qty 2

## 2019-09-09 MED ORDER — LEVETIRACETAM 750 MG PO TABS
750.0000 mg | ORAL_TABLET | Freq: Every day | ORAL | Status: DC
  Filled 2019-09-09: qty 1

## 2019-09-09 MED ORDER — LEVETIRACETAM 750 MG PO TABS: 750.0000 mg | ORAL_TABLET | Freq: Two times a day (BID) | ORAL | Status: DC

## 2019-09-09 MED ORDER — CARBAMAZEPINE ER 200 MG PO TB12
600.0000 mg | ORAL_TABLET | Freq: Two times a day (BID) | ORAL | Status: DC
  Filled 2019-09-09 (×2): qty 3

## 2019-09-09 NOTE — Progress Notes (Signed)
Discharge order received from doctor. Reviewed discharge instructions with patient and answered all questions. Follow up appointment instructions given. Patient verbalized understanding. Patient discharged home via wheelchair by nursing/auxillary.     Hilbert Bible, RN

## 2019-09-09 NOTE — Discharge Summary (Signed)
Physician Discharge Summary  Patient ID: Sheila Moore MRN: [id number] DOB/AGE: Dec 07, 1975 44 y.o.  Admit date: 09/08/2019 Discharge date: 09/09/2019  Admission Diagnoses:  Discharge Diagnoses:  Active Problems:   Acute blood loss anemia   Discharged Condition: good  Hospital Course: 44 y.o. [id number] admitted for preoperative blood transfusion for Hgb of 6.0 on preadmission labs.  Patient received 2 units of pRBC with rise in Hgb to 8.7  The patient will be discharged until her surgery tomorrow 09/10/2019.  Consults: None  Significant Diagnostic Studies: Results for orders placed or performed during the hospital encounter of 09/08/19 (from the past 72 hour(s))  Prepare RBC (crossmatch)     Status: None   Collection Time: 09/08/19  4:00 PM  Result Value Ref Range   Order Confirmation      ORDER PROCESSED BY BLOOD BANK Performed at [facility], [address] [place], [place] [address]   Type and screen     Status: None (Preliminary result)   Collection Time: 09/08/19  5:12 PM  Result Value Ref Range   ABO/RH(D) O POS    Antibody Screen NEG    Sample Expiration 09/11/2019,2359    Unit Number [id number]    Blood Component Type RED CELLS,LR    Unit division 00    Status of Unit ISSUED    Transfusion Status OK TO TRANSFUSE    Crossmatch Result Compatible    Unit Number [id number]    Blood Component Type RED CELLS,LR    Unit division 00    Status of Unit ISSUED    Transfusion Status OK TO TRANSFUSE    Crossmatch Result      Compatible Performed at [facility], [address] [place], [place] [address]   CBC     Status: Abnormal   Collection Time: 09/09/19  7:05 AM  Result Value Ref Range   WBC 7.9 4.0 - 10.5 K/uL   RBC 4.69 3.87 - 5.11 MIL/uL   Hemoglobin 8.7 (L) 12.0 - 15.0 g/dL    Comment: REPEATED TO VERIFY Reticulocyte Hemoglobin testing may be clinically indicated, consider ordering this additional test [id number]    HCT 30.7 (L)  36.0 - 46.0 %   MCV 65.5 (L) 80.0 - 100.0 fL   MCH 18.6 (L) 26.0 - 34.0 pg   MCHC 28.3 (L) 30.0 - 36.0 g/dL   RDW 23.5 (H) 11.5 - 15.5 %   Platelets 385 150 - 400 K/uL   nRBC 0.0 0.0 - 0.2 %    Comment: Performed at [facility], [address] [place],  [address]     Treatments: IV hydration and blood transfusion  Discharge Exam: Blood pressure 136/85, pulse (!) 55, temperature 98.5 F (36.9 C), temperature source Oral, resp. rate 20, SpO2 98 %. General: NAD, well nourished, appears stated age Pulmonary: no increased work of breathing Extremities: no edema Neurologic: normal gait    Disposition: Discharge disposition: 01-Home or Self Care       Discharge Instructions    Activity as tolerated   Complete by: As directed    Call MD for:   Complete by: As directed    For heavy vaginal bleeding greater than 1 pad an hour   Call MD for:  difficulty breathing, headache or visual disturbances   Complete by: As directed    Call MD for:  extreme fatigue   Complete by: As directed    Call MD for:  hives   Complete by: As directed  Call MD for:  persistant dizziness or light-headedness   Complete by: As directed    Call MD for:  persistant nausea and vomiting   Complete by: As directed    Call MD for:  severe uncontrolled pain   Complete by: As directed    Call MD for:  temperature >100.4   Complete by: As directed    Diet general   Complete by: As directed      Allergies as of 09/09/2019   No Known Allergies     Medication List    TAKE these medications   carbamazepine 300 MG 12 hr capsule Commonly known as: CARBATROL Take 600 mg by mouth 2 (two) times daily.   ibuprofen 200 MG tablet Commonly known as: ADVIL Take 600 mg by mouth every 8 (eight) hours as needed (pain.).   levETIRAcetam 500 MG tablet Commonly known as: KEPPRA Take 750-1,000 mg by mouth See admin instructions. Take 2 tablets (1000 mg) by mouth in the morning & take 1.5  tablets (750 mg) by mouth at night.   medroxyPROGESTERone 10 MG tablet Commonly known as: PROVERA Take 2 tablets (20 mg total) by mouth daily. What changed: how much to take        Signed: Malachy Mood 09/09/2019, 7:56 AM

## 2019-09-10 ENCOUNTER — Encounter
Admission: RE | Disposition: A | Discharge status: Home / Self Care | Payer: Self-pay | Source: Home / Self Care | Attending: Obstetrics and Gynecology

## 2019-09-10 ENCOUNTER — Other Ambulatory Visit: Payer: Self-pay

## 2019-09-10 ENCOUNTER — Encounter: Payer: Self-pay | Admitting: Obstetrics and Gynecology

## 2019-09-10 ENCOUNTER — Ambulatory Visit: Payer: Medicare Other | Admitting: Anesthesiology

## 2019-09-10 ENCOUNTER — Observation Stay
Admission: RE | Admit: 2019-09-10 | Discharge: 2019-09-11 | Disposition: A | Discharge status: Home / Self Care | Payer: Medicare Other | Attending: Obstetrics and Gynecology | Admitting: Obstetrics and Gynecology

## 2019-09-10 DIAGNOSIS — D500 Iron deficiency anemia secondary to blood loss (chronic): Secondary | ICD-10-CM | POA: Diagnosis not present

## 2019-09-10 DIAGNOSIS — E221 Hyperprolactinemia: Secondary | ICD-10-CM

## 2019-09-10 DIAGNOSIS — R569 Unspecified convulsions: Secondary | ICD-10-CM | POA: Insufficient documentation

## 2019-09-10 DIAGNOSIS — D259 Leiomyoma of uterus, unspecified: Secondary | ICD-10-CM | POA: Diagnosis not present

## 2019-09-10 DIAGNOSIS — Z86011 Personal history of benign neoplasm of the brain: Secondary | ICD-10-CM | POA: Diagnosis not present

## 2019-09-10 DIAGNOSIS — N802 Endometriosis of fallopian tube: Secondary | ICD-10-CM | POA: Insufficient documentation

## 2019-09-10 DIAGNOSIS — N920 Excessive and frequent menstruation with regular cycle: Principal | ICD-10-CM | POA: Insufficient documentation

## 2019-09-10 DIAGNOSIS — N736 Female pelvic peritoneal adhesions (postinfective): Secondary | ICD-10-CM | POA: Insufficient documentation

## 2019-09-10 DIAGNOSIS — D62 Acute posthemorrhagic anemia: Secondary | ICD-10-CM | POA: Diagnosis not present

## 2019-09-10 DIAGNOSIS — Z9079 Acquired absence of other genital organ(s): Secondary | ICD-10-CM | POA: Insufficient documentation

## 2019-09-10 DIAGNOSIS — D497 Neoplasm of unspecified behavior of endocrine glands and other parts of nervous system: Secondary | ICD-10-CM | POA: Diagnosis not present

## 2019-09-10 DIAGNOSIS — Z79899 Other long term (current) drug therapy: Secondary | ICD-10-CM | POA: Diagnosis not present

## 2019-09-10 DIAGNOSIS — E237 Disorder of pituitary gland, unspecified: Secondary | ICD-10-CM | POA: Diagnosis not present

## 2019-09-10 DIAGNOSIS — Z90710 Acquired absence of both cervix and uterus: Secondary | ICD-10-CM | POA: Diagnosis present

## 2019-09-10 HISTORY — PX: LAPAROSCOPIC SUPRACERVICAL HYSTERECTOMY: SHX5399

## 2019-09-10 HISTORY — PX: CYSTOSCOPY: SHX5120

## 2019-09-10 LAB — BPAM RBC
Blood Product Expiration Date: 202105262359
Blood Product Expiration Date: 202105262359
Unit Type and Rh: 5100
Unit Type and Rh: 5100

## 2019-09-10 LAB — TYPE AND SCREEN
ABO/RH(D): O POS
Antibody Screen: NEGATIVE
Unit division: 0
Unit division: 0

## 2019-09-10 LAB — POCT PREGNANCY, URINE: Preg Test, Ur: NEGATIVE

## 2019-09-10 LAB — PREPARE RBC (CROSSMATCH)

## 2019-09-10 SURGERY — HYSTERECTOMY, SUPRACERVICAL, LAPAROSCOPIC
Anesthesia: General | Laterality: Bilateral

## 2019-09-10 MED ORDER — KETOROLAC TROMETHAMINE 30 MG/ML IJ SOLN
INTRAMUSCULAR | Status: AC
  Filled 2019-09-10: qty 1

## 2019-09-10 MED ORDER — LIDOCAINE HCL (PF) 2 % IJ SOLN
INTRAMUSCULAR | Status: AC
  Filled 2019-09-10: qty 5

## 2019-09-10 MED ORDER — CEFAZOLIN SODIUM-DEXTROSE 2-4 GM/100ML-% IV SOLN
2.0000 g | INTRAVENOUS | Status: AC
  Administered 2019-09-10: 2 g via INTRAVENOUS

## 2019-09-10 MED ORDER — MORPHINE SULFATE (PF) 4 MG/ML IV SOLN
1.0000 mg | INTRAVENOUS | Status: DC | PRN
  Administered 2019-09-10 (×2): 2 mg via INTRAVENOUS
  Filled 2019-09-10 (×2): qty 1

## 2019-09-10 MED ORDER — HYDROCODONE-ACETAMINOPHEN 7.5-325 MG PO TABS
1.0000 | ORAL_TABLET | Freq: Once | ORAL | Status: DC | PRN
  Filled 2019-09-10: qty 1

## 2019-09-10 MED ORDER — FENTANYL CITRATE (PF) 100 MCG/2ML IJ SOLN
INTRAMUSCULAR | Status: AC
  Filled 2019-09-10: qty 2

## 2019-09-10 MED ORDER — ACETAMINOPHEN NICU IV SYRINGE 10 MG/ML
INTRAVENOUS | Status: AC
  Filled 2019-09-10: qty 1

## 2019-09-10 MED ORDER — DEXAMETHASONE SODIUM PHOSPHATE 10 MG/ML IJ SOLN
INTRAMUSCULAR | Status: AC
  Filled 2019-09-10: qty 1

## 2019-09-10 MED ORDER — BUPIVACAINE HCL (PF) 0.5 % IJ SOLN
INTRAMUSCULAR | Status: AC
  Filled 2019-09-10: qty 30

## 2019-09-10 MED ORDER — FAMOTIDINE 20 MG PO TABS: 20.0000 mg | ORAL_TABLET | Freq: Once | ORAL | Status: AC

## 2019-09-10 MED ORDER — SUCCINYLCHOLINE CHLORIDE 200 MG/10ML IV SOSY
PREFILLED_SYRINGE | INTRAVENOUS | Status: AC
  Filled 2019-09-10: qty 10

## 2019-09-10 MED ORDER — PROPOFOL 10 MG/ML IV BOLUS
INTRAVENOUS | Status: AC
  Filled 2019-09-10: qty 20

## 2019-09-10 MED ORDER — FAMOTIDINE 20 MG PO TABS
ORAL_TABLET | ORAL | Status: AC
  Administered 2019-09-10: 20 mg via ORAL
  Filled 2019-09-10: qty 1

## 2019-09-10 MED ORDER — MIDAZOLAM HCL 2 MG/2ML IJ SOLN
INTRAMUSCULAR | Status: AC
  Filled 2019-09-10: qty 2

## 2019-09-10 MED ORDER — ROCURONIUM BROMIDE 100 MG/10ML IV SOLN
INTRAVENOUS | Status: DC | PRN
  Administered 2019-09-10: 20 mg via INTRAVENOUS
  Administered 2019-09-10 (×2): 10 mg via INTRAVENOUS
  Administered 2019-09-10: 15 mg via INTRAVENOUS
  Administered 2019-09-10: 5 mg via INTRAVENOUS
  Administered 2019-09-10: 15 mg via INTRAVENOUS
  Administered 2019-09-10: 10 mg via INTRAVENOUS
  Administered 2019-09-10: 50 mg via INTRAVENOUS
  Administered 2019-09-10: 5 mg via INTRAVENOUS

## 2019-09-10 MED ORDER — IBUPROFEN 800 MG PO TABS: 800.0000 mg | ORAL_TABLET | Freq: Four times a day (QID) | ORAL | Status: DC

## 2019-09-10 MED ORDER — ROCURONIUM BROMIDE 10 MG/ML (PF) SYRINGE
PREFILLED_SYRINGE | INTRAVENOUS | Status: AC
  Filled 2019-09-10: qty 10

## 2019-09-10 MED ORDER — KETOROLAC TROMETHAMINE 30 MG/ML IJ SOLN: 30.0000 mg | Freq: Four times a day (QID) | INTRAMUSCULAR | Status: DC

## 2019-09-10 MED ORDER — ACETAMINOPHEN 325 MG PO TABS: 325.0000 mg | ORAL_TABLET | ORAL | Status: DC | PRN

## 2019-09-10 MED ORDER — LEVETIRACETAM 500 MG PO TABS
1000.0000 mg | ORAL_TABLET | Freq: Every morning | ORAL | Status: DC
  Administered 2019-09-11: 1000 mg via ORAL
  Filled 2019-09-10: qty 2

## 2019-09-10 MED ORDER — BUPIVACAINE HCL 0.5 % IJ SOLN
INTRAMUSCULAR | Status: DC | PRN
  Administered 2019-09-10: 10 mL

## 2019-09-10 MED ORDER — MEPERIDINE HCL 50 MG/ML IJ SOLN: 6.2500 mg | INTRAMUSCULAR | Status: DC | PRN

## 2019-09-10 MED ORDER — ONDANSETRON HCL 4 MG PO TABS: 4.0000 mg | ORAL_TABLET | Freq: Four times a day (QID) | ORAL | Status: DC | PRN

## 2019-09-10 MED ORDER — ONDANSETRON HCL 4 MG/2ML IJ SOLN
INTRAMUSCULAR | Status: DC | PRN
  Administered 2019-09-10: 4 mg via INTRAVENOUS

## 2019-09-10 MED ORDER — KETOROLAC TROMETHAMINE 30 MG/ML IJ SOLN
INTRAMUSCULAR | Status: DC | PRN
  Administered 2019-09-10: 30 mg via INTRAVENOUS

## 2019-09-10 MED ORDER — OXYCODONE-ACETAMINOPHEN 5-325 MG PO TABS: 2.0000 | ORAL_TABLET | ORAL | Status: DC | PRN

## 2019-09-10 MED ORDER — KETOROLAC TROMETHAMINE 30 MG/ML IJ SOLN
30.0000 mg | Freq: Four times a day (QID) | INTRAMUSCULAR | Status: AC
  Administered 2019-09-10 – 2019-09-11 (×3): 30 mg via INTRAVENOUS
  Filled 2019-09-10 (×3): qty 1

## 2019-09-10 MED ORDER — CARBAMAZEPINE ER 200 MG PO TB12
600.0000 mg | ORAL_TABLET | Freq: Two times a day (BID) | ORAL | Status: DC
  Administered 2019-09-10 – 2019-09-11 (×2): 600 mg via ORAL
  Filled 2019-09-10 (×3): qty 3

## 2019-09-10 MED ORDER — LACTATED RINGERS IV SOLN: INTRAVENOUS | Status: DC

## 2019-09-10 MED ORDER — PROPOFOL 10 MG/ML IV BOLUS
INTRAVENOUS | Status: DC | PRN
  Administered 2019-09-10: 150 mg via INTRAVENOUS

## 2019-09-10 MED ORDER — ACETAMINOPHEN 10 MG/ML IV SOLN
INTRAVENOUS | Status: DC | PRN
  Administered 2019-09-10: 1000 mg via INTRAVENOUS

## 2019-09-10 MED ORDER — LEVETIRACETAM 750 MG PO TABS
750.0000 mg | ORAL_TABLET | Freq: Every day | ORAL | Status: DC
  Administered 2019-09-10: 750 mg via ORAL
  Filled 2019-09-10 (×2): qty 1

## 2019-09-10 MED ORDER — SUGAMMADEX SODIUM 200 MG/2ML IV SOLN
INTRAVENOUS | Status: DC | PRN
  Administered 2019-09-10: 200 mg via INTRAVENOUS

## 2019-09-10 MED ORDER — SIMETHICONE 80 MG PO CHEW: 80.0000 mg | CHEWABLE_TABLET | Freq: Four times a day (QID) | ORAL | Status: DC | PRN

## 2019-09-10 MED ORDER — PROMETHAZINE HCL 25 MG/ML IJ SOLN: 6.2500 mg | INTRAMUSCULAR | Status: DC | PRN

## 2019-09-10 MED ORDER — DEXMEDETOMIDINE HCL IN NACL 80 MCG/20ML IV SOLN
INTRAVENOUS | Status: AC
  Filled 2019-09-10: qty 20

## 2019-09-10 MED ORDER — FENTANYL CITRATE (PF) 100 MCG/2ML IJ SOLN
25.0000 ug | INTRAMUSCULAR | Status: DC | PRN
  Administered 2019-09-10 (×2): 50 ug via INTRAVENOUS

## 2019-09-10 MED ORDER — CEFAZOLIN SODIUM-DEXTROSE 2-4 GM/100ML-% IV SOLN
INTRAVENOUS | Status: AC
  Filled 2019-09-10: qty 100

## 2019-09-10 MED ORDER — FENTANYL CITRATE (PF) 100 MCG/2ML IJ SOLN
INTRAMUSCULAR | Status: DC | PRN
  Administered 2019-09-10 (×2): 50 ug via INTRAVENOUS
  Administered 2019-09-10: 100 ug via INTRAVENOUS
  Administered 2019-09-10: 50 ug via INTRAVENOUS

## 2019-09-10 MED ORDER — MENTHOL 3 MG MT LOZG
1.0000 | LOZENGE | OROMUCOSAL | Status: DC | PRN
  Filled 2019-09-10: qty 9

## 2019-09-10 MED ORDER — DEXAMETHASONE SODIUM PHOSPHATE 10 MG/ML IJ SOLN
INTRAMUSCULAR | Status: DC | PRN
  Administered 2019-09-10: 10 mg via INTRAVENOUS

## 2019-09-10 MED ORDER — KETOROLAC TROMETHAMINE 30 MG/ML IJ SOLN: 30.0000 mg | Freq: Once | INTRAMUSCULAR | Status: DC | PRN

## 2019-09-10 MED ORDER — LIDOCAINE HCL (CARDIAC) PF 100 MG/5ML IV SOSY
PREFILLED_SYRINGE | INTRAVENOUS | Status: DC | PRN
  Administered 2019-09-10: 100 mg via INTRAVENOUS

## 2019-09-10 MED ORDER — MIDAZOLAM HCL 2 MG/2ML IJ SOLN
INTRAMUSCULAR | Status: DC | PRN
  Administered 2019-09-10: 2 mg via INTRAVENOUS

## 2019-09-10 MED ORDER — ONDANSETRON HCL 4 MG/2ML IJ SOLN: 4.0000 mg | Freq: Four times a day (QID) | INTRAMUSCULAR | Status: DC | PRN

## 2019-09-10 MED ORDER — DEXTROSE-NACL 5-0.45 % IV SOLN: INTRAVENOUS | Status: DC

## 2019-09-10 MED ORDER — ONDANSETRON HCL 4 MG/2ML IJ SOLN
INTRAMUSCULAR | Status: AC
  Filled 2019-09-10: qty 2

## 2019-09-10 MED ORDER — ACETAMINOPHEN 160 MG/5ML PO SOLN
325.0000 mg | ORAL | Status: DC | PRN
  Filled 2019-09-10: qty 20.3

## 2019-09-10 MED ORDER — DEXMEDETOMIDINE HCL 200 MCG/2ML IV SOLN
INTRAVENOUS | Status: DC | PRN
  Administered 2019-09-10 (×2): 8 ug via INTRAVENOUS

## 2019-09-10 SURGICAL SUPPLY — 57 items
ADH SKN CLS APL DERMABOND .7 (GAUZE/BANDAGES/DRESSINGS) ×3
ANCHOR TIS RET SYS 1550ML (BAG) ×2 IMPLANT
APL PRP STRL LF DISP 70% ISPRP (MISCELLANEOUS) ×3
BAG DRN RND TRDRP ANRFLXCHMBR (UROLOGICAL SUPPLIES) ×3
BAG SPEC RTRVL C1550 25.4 (BAG) ×3
BAG URINE DRAIN 2000ML AR STRL (UROLOGICAL SUPPLIES) ×4 IMPLANT
BLADE SURG SZ11 CARB STEEL (BLADE) ×4 IMPLANT
CANISTER SUCT 1200ML W/VALVE (MISCELLANEOUS) ×4 IMPLANT
CATH FOLEY 2WAY  5CC 16FR (CATHETERS) ×4
CATH FOLEY 2WAY 5CC 16FR (CATHETERS) ×3
CATH URTH 16FR FL 2W BLN LF (CATHETERS) ×3 IMPLANT
CHLORAPREP W/TINT 26 (MISCELLANEOUS) ×4 IMPLANT
DEFOGGER SCOPE WARMER CLEARIFY (MISCELLANEOUS) ×4 IMPLANT
DERMABOND ADVANCED (GAUZE/BANDAGES/DRESSINGS) ×1
DERMABOND ADVANCED .7 DNX12 (GAUZE/BANDAGES/DRESSINGS) ×3 IMPLANT
DEVICE SUTURE ENDOST 10MM (ENDOMECHANICALS) ×2 IMPLANT
GLOVE BIO SURGEON STRL SZ7 (GLOVE) ×16 IMPLANT
GLOVE INDICATOR 7.5 STRL GRN (GLOVE) ×10 IMPLANT
GOWN STRL REUS W/ TWL LRG LVL3 (GOWN DISPOSABLE) ×6 IMPLANT
GOWN STRL REUS W/ TWL XL LVL3 (GOWN DISPOSABLE) ×3 IMPLANT
GOWN STRL REUS W/TWL LRG LVL3 (GOWN DISPOSABLE) ×8
GOWN STRL REUS W/TWL XL LVL3 (GOWN DISPOSABLE) ×4
GRASPER SUT TROCAR 14GX15 (MISCELLANEOUS) ×2 IMPLANT
IRRIGATION STRYKERFLOW (MISCELLANEOUS) ×2 IMPLANT
IRRIGATOR STRYKERFLOW (MISCELLANEOUS)
IV LACTATED RINGERS 1000ML (IV SOLUTION) ×4 IMPLANT
IV NS 1000ML (IV SOLUTION) ×4
IV NS 1000ML BAXH (IV SOLUTION) ×3 IMPLANT
KIT PINK PAD W/HEAD ARE REST (MISCELLANEOUS) ×4
KIT PINK PAD W/HEAD ARM REST (MISCELLANEOUS) ×3 IMPLANT
LABEL OR SOLS (LABEL) ×4 IMPLANT
MANIPULATOR VCARE LG CRV RETR (MISCELLANEOUS) IMPLANT
MANIPULATOR VCARE SML CRV RETR (MISCELLANEOUS) IMPLANT
MANIPULATOR VCARE STD CRV RETR (MISCELLANEOUS) ×2 IMPLANT
NS IRRIG 500ML POUR BTL (IV SOLUTION) ×4 IMPLANT
OCCLUDER COLPOPNEUMO (BALLOONS) ×2 IMPLANT
PACK GYN LAPAROSCOPIC (MISCELLANEOUS) ×4 IMPLANT
PAD OB MATERNITY 4.3X12.25 (PERSONAL CARE ITEMS) ×4 IMPLANT
PAD PREP 24X41 OB/GYN DISP (PERSONAL CARE ITEMS) ×4 IMPLANT
SCISSORS METZENBAUM CVD 33 (INSTRUMENTS) ×4 IMPLANT
SET CYSTO W/LG BORE CLAMP LF (SET/KITS/TRAYS/PACK) ×4 IMPLANT
SHEARS HARMONIC ACE PLUS 36CM (ENDOMECHANICALS) ×4 IMPLANT
SLEEVE ENDOPATH XCEL 5M (ENDOMECHANICALS) ×4 IMPLANT
STRAP SAFETY 5IN WIDE (MISCELLANEOUS) ×4 IMPLANT
SUT ENDO VLOC 180-0-8IN (SUTURE) ×2 IMPLANT
SUT MNCRL 4-0 (SUTURE) ×8
SUT MNCRL 4-0 27XMFL (SUTURE) ×6
SUT VIC AB 0 CT1 27 (SUTURE) ×8
SUT VIC AB 0 CT1 27XCR 8 STRN (SUTURE) ×4 IMPLANT
SUT VIC AB 0 CT1 36 (SUTURE) ×2 IMPLANT
SUTURE MNCRL 4-0 27XMF (SUTURE) ×6 IMPLANT
SYR 10ML LL (SYRINGE) ×4 IMPLANT
SYR 50ML LL SCALE MARK (SYRINGE) ×4 IMPLANT
TROCAR BLADELESS 15MM (ENDOMECHANICALS) ×2 IMPLANT
TROCAR ENDO BLADELESS 11MM (ENDOMECHANICALS) ×2 IMPLANT
TROCAR XCEL NON-BLD 5MMX100MML (ENDOMECHANICALS) ×4 IMPLANT
TUBING EVAC SMOKE HEATED PNEUM (TUBING) ×4 IMPLANT

## 2019-09-10 NOTE — Anesthesia Preprocedure Evaluation (Addendum)
Anesthesia Evaluation  Patient identified by MRN, date of birth, ID band Patient awake    Reviewed: Allergy & Precautions, H&P , NPO status , reviewed documented beta blocker date and time   Airway Mallampati: II  TM Distance: >3 FB Neck ROM: full    Dental  (+) Teeth Intact   Pulmonary    Pulmonary exam normal        Cardiovascular Normal cardiovascular exam     Neuro/Psych Seizures -, Well Controlled,  Last seizure >38yrs ago    GI/Hepatic neg GERD  ,  Endo/Other    Renal/GU      Musculoskeletal   Abdominal   Peds  Hematology  (+) Blood dyscrasia, anemia ,   Anesthesia Other Findings Past Medical History: No date: Blood transfusion without reported diagnosis No date: Brain tumor (benign) ([place]) No date: Seizures ([place]) Past Surgical History: 2010: BRAIN SURGERY     Comment:  tumor removed No date: CESAREAN SECTION No date: MUSCLE BIOPSY, Negative for MH, has had GA since w/o problem     Comment:  to check for maligmnant hyperthermia   Reproductive/Obstetrics                           Anesthesia Physical Anesthesia Plan  ASA: III  Anesthesia Plan: General   Post-op Pain Management:    Induction: Intravenous  PONV Risk Score and Plan: 3 and Ondansetron, Treatment may vary due to age or medical condition, Dexamethasone and Midazolam  Airway Management Planned: Oral ETT  Additional Equipment:   Intra-op Plan:   Post-operative Plan: Extubation in OR  Informed Consent: I have reviewed the patients History and Physical, chart, labs and discussed the procedure including the risks, benefits and alternatives for the proposed anesthesia with the patient or authorized representative who has indicated his/her understanding and acceptance.     Dental Advisory Given  Plan Discussed with: CRNA  Anesthesia Plan Comments:        Anesthesia Quick Evaluation

## 2019-09-10 NOTE — Anesthesia Procedure Notes (Signed)
Procedure Name: Intubation Date/Time: 09/10/2019 11:13 AM Performed by: Junie Bame, RN Pre-anesthesia Checklist: Patient identified, Patient being monitored, Timeout performed, Emergency Drugs available and Suction available Patient Re-evaluated:Patient Re-evaluated prior to induction Oxygen Delivery Method: Circle system utilized Preoxygenation: Pre-oxygenation with 100% oxygen Induction Type: IV induction Ventilation: Mask ventilation without difficulty Laryngoscope Size: 3 and McGraph Grade View: Grade I Tube type: Oral Tube size: 7.0 mm Number of attempts: 1 Airway Equipment and Method: Stylet and Video-laryngoscopy Placement Confirmation: ETT inserted through vocal cords under direct vision,  positive ETCO2 and breath sounds checked- equal and bilateral Secured at: 20 cm Tube secured with: Tape Dental Injury: Teeth and Oropharynx as per pre-operative assessment

## 2019-09-10 NOTE — Transfer of Care (Signed)
Immediate Anesthesia Transfer of Care Note  Patient: Sheila Moore  Procedure(s) Performed: TOTAL LAPAROSCOPIC HYSTERECTOMY WITH BILATERAL SALPINGECTOMY (N/A ) CYSTOSCOPY  Patient Location: PACU  Anesthesia Type:General  Level of Consciousness: sedated  Airway & Oxygen Therapy: Patient Spontanous Breathing and Patient connected to face mask oxygen  Post-op Assessment: Report given to RN and Post -op Vital signs reviewed and stable  Post vital signs: Reviewed and stable  Last Vitals:  Vitals Value Taken Time  BP 92/51 09/10/19 1541  Temp    Pulse 66 09/10/19 1541  Resp 13 09/10/19 1541  SpO2 100 % 09/10/19 1541  Vitals shown include unvalidated device data.  Last Pain:  Vitals:   09/10/19 1541  TempSrc:   PainSc: 0-No pain         Complications: No apparent anesthesia complications

## 2019-09-10 NOTE — H&P (Signed)
Date of Initial H&P: 08/31/2019  History reviewed, patient examined, no change in status, stable for surgery.  Was admitted for preoperative blood transfusion in the itnerime 09/08/2019 with rise in Hgb from 6.0 to 8.7.  Malachy Mood, MD, [facility], [facility] 09/10/2019, 10:55 AM

## 2019-09-10 NOTE — Anesthesia Postprocedure Evaluation (Signed)
Anesthesia Post Note  Patient: Sheila Moore  Procedure(s) Performed: TOTAL LAPAROSCOPIC HYSTERECTOMY WITH BILATERAL SALPINGECTOMY (Bilateral ) CYSTOSCOPY  Patient location during evaluation: PACU Anesthesia Type: General Level of consciousness: awake and alert Pain management: pain level controlled Vital Signs Assessment: post-procedure vital signs reviewed and stable Respiratory status: spontaneous breathing, nonlabored ventilation and respiratory function stable Cardiovascular status: blood pressure returned to baseline and stable Postop Assessment: no apparent nausea or vomiting Anesthetic complications: no     Last Vitals:  Vitals:   09/10/19 1654 09/10/19 1750  BP: 110/65 115/72  Pulse: 70 70  Resp: 16 18  Temp: 36.4 C 36.5 C  SpO2: 99% 99%    Last Pain:  Vitals:   09/10/19 1800  TempSrc:   PainSc: 0-No pain                 Tera Mater

## 2019-09-10 NOTE — Op Note (Signed)
Preoperative Diagnosis: 1) 44 y.o. 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  This surgery required a high level surgical assistant with none other readily available  Anesthesia: General  Preoperative Antibiotics: none  Estimated Blood Loss: 100 mL  IV Fluids: 1376mL crystaloid  Urine Output:: 1577mL  Drains or Tubes: Foley to gravity drainage  Implants: none  Specimens Removed: Uterus and bilateral fallopian tubes  Complications: none  Intraoperative Findings: Uterus fused to the anterior abdominal wall with dense adhesive disease involving the bladder.  Ovaries appeared normal bilaterally.  Evidence of prior partial salpingectomy bilateral.    Patient Condition: stable  Procedure in Detail:  Patient was taken to the operating room where she was administered general anesthesia.  She was positioned in the dorsal lithotomy position utilizing Allen stirups, prepped and draped in the usual sterile fashion.  Prior to proceeding with procedure a time out was performed.  Attention was turned to the patient's pelvis.  An indwelling foley catheter was placed to decompress the patient's bladder.  An operative speculum was placed to allow visualization of the cervix.  The anterior lip of the cervix was grasped with a single tooth tenaculum, a V-care uterine manipulator was not placed after inability to sound the uterus.  The cervix was displaced cephalad and under the pubic symphysis. The  operative speculum and single tooth tenaculum were then removed.  Attention was turned to the patient's abdomen.  The umbilicus was infiltrated with 1% Sensorcaine, before making a stab incision using an 11 blade scalpel.  A 4mm Excel trocar was then used to gain direct entry into the peritoneal cavity utilizing the camera to visualize progress of the trocar during placement.  Once peritoneal entry had been achieved, insufflation was started and pneumoperitoneum established at a pressure of 66mmHg.    One left and one right lower quadrant site were then injected with 1% Sensorcaine and a stab incision was made using an 11 blade scalpel.  Two additional 18mm Excel trocars were placed through these incisions under direct visualization. General inspection of the abdomen revealed the above noted findings.   Attentions was turned to the fundal adhesion of the uterus to the abdominal wall.  There was a clear space that was able to be developed posteriorly.  The adhesion was then lysed with a 16mm harmonic scalpel.  The left adnexal structures were densely adherent. So attention was turned to the right adnexal structures which were dissected by Dr. Glennon Mac.      The right tube was identified and grasped at its fimbriated end.  The tube was transected from its attachments to the ovary and mesosalpinx using a 65mm Harmonic scalpel.  The utero ovarian ligament was identified ligated and transected using the Harmonic scalpel. The round ligament was then likewise ligated and transected.  The anterior leaf of the broad ligament was dissected to approximately the same level..  This allowed some additional mobility and the bladder adhesion of to the anterior uterus was able to be dissected further using hydro-dissecton.    The adhesion did not evidence a clear plane superiorly and was relatively thick.  At the point the remnant of the round ligament on the left were identified.  This was densely adherent in scar tissue as well  and was dissected using the 48mm Harmonic.  The peritoneum was opened toward the IP ligament.  The utero-ovarian ligament and tube were transected further opening up the retroperitoneum and left broad ligament.  The uterine artery was skeletonized.  A opening of the uterine serosa was dissected down to the level of the bladder adhesion.    The lateral aspects were able to be free using hydro-dissection but the anterior adhesion failed to show evidence of a clear safe plane.    At this point the uterus had been returned to an anatomically normal position and attempt was made at resounding the uterus and placing a V-care manipulator which was successful.  With additional uterine manipulation and pressing the uterus cephalad The dissection had been carried down sufficiently on both sides of the specimen to be amenable to a supracervical hysterectomy.  Decision was made to retain the cervix given the high risk of cystotomy with continued attempts at dissection the bladder of there cervix.  Both uterine arteries were transected with myself performing the left sided transection and Dr. Glennon Mac the right.  The specimen was then amputated at the level of the interval cervical os using the 72mm Harmonic scalpel.     The 3mm umbilical port site was extended to allow use of a 16mm Excel trocar.  A large laparoscopic retrieval bag was then introduced into the abdomen and the uterine specimen was placed within this.  The bag was brought to the incision and the fascia extended using Mayo scissors.  The specimen was then morcellated within the bag using an 11 blade scalpel.   Following successful morcellation the fascial defect was closed using a running 0 Vicryl.  Before tying down the facial stitch a 32mm Excel trocar was replaced throught the umbilical fascial defect and pneumoperitoneum re-established.  The pedicles were inspected noted to be hemostatic.  The fimbriated portion of the left fallopian tube was removed using a  50mm Harmonic scalpel.  The cervical stump was cauterized using monopolar scissors.  Pneumoperitoneum was evacuated.  The umbilical fascial stitch that had been placed was tied down.  The umbilical trocar site was then closed using a 4-0 Monocryl in a subcuticular fashion.  All trocar sites were then dressed with surgical skin glue.   Attention was turned to the patient's pelvis.  The V-care manipulator was removed from the cervix.  The indwelling foley catheter was removed.  Cystoscopy was performed noting and intact bladder dome as well as efflux of urine from bother ureteral orifices.  As was evident on the laparoscopy the bladder adhesions actually displaced the trigone pulling the left ureter cephalad.  The cystoscopy was removed and the indwelling foley catheter was replaced.  All sponge needle and instrument counts were correct time two.  The patient tolerated the procedure well and was taken to the recovery room in stable condition.

## 2019-09-11 DIAGNOSIS — E237 Disorder of pituitary gland, unspecified: Secondary | ICD-10-CM | POA: Diagnosis not present

## 2019-09-11 DIAGNOSIS — N736 Female pelvic peritoneal adhesions (postinfective): Secondary | ICD-10-CM | POA: Diagnosis not present

## 2019-09-11 DIAGNOSIS — D500 Iron deficiency anemia secondary to blood loss (chronic): Secondary | ICD-10-CM | POA: Diagnosis not present

## 2019-09-11 DIAGNOSIS — N920 Excessive and frequent menstruation with regular cycle: Secondary | ICD-10-CM | POA: Diagnosis not present

## 2019-09-11 DIAGNOSIS — Z86011 Personal history of benign neoplasm of the brain: Secondary | ICD-10-CM | POA: Diagnosis not present

## 2019-09-11 DIAGNOSIS — E221 Hyperprolactinemia: Secondary | ICD-10-CM | POA: Diagnosis not present

## 2019-09-11 DIAGNOSIS — Z79899 Other long term (current) drug therapy: Secondary | ICD-10-CM | POA: Diagnosis not present

## 2019-09-11 DIAGNOSIS — R569 Unspecified convulsions: Secondary | ICD-10-CM | POA: Diagnosis not present

## 2019-09-11 LAB — CBC
HCT: 29.9 % — ABNORMAL LOW (ref 36.0–46.0)
Hemoglobin: 8.1 g/dL — ABNORMAL LOW (ref 12.0–15.0)
MCH: 18.3 pg — ABNORMAL LOW (ref 26.0–34.0)
MCHC: 27.1 g/dL — ABNORMAL LOW (ref 30.0–36.0)
MCV: 67.5 fL — ABNORMAL LOW (ref 80.0–100.0)
Platelets: 357 10*3/uL (ref 150–400)
RBC: 4.43 MIL/uL (ref 3.87–5.11)
RDW: 24.3 % — ABNORMAL HIGH (ref 11.5–15.5)
WBC: 13.3 10*3/uL — ABNORMAL HIGH (ref 4.0–10.5)
nRBC: 0 % (ref 0.0–0.2)

## 2019-09-11 LAB — BASIC METABOLIC PANEL
Anion gap: 8 (ref 5–15)
BUN: 8 mg/dL (ref 6–20)
CO2: 26 mmol/L (ref 22–32)
Calcium: 8.9 mg/dL (ref 8.9–10.3)
Chloride: 105 mmol/L (ref 98–111)
Creatinine, Ser: 0.84 mg/dL (ref 0.44–1.00)
GFR calc Af Amer: >60 mL/min (ref >60)
GFR calc non Af Amer: >60 mL/min (ref >60)
Glucose, Bld: 124 mg/dL — ABNORMAL HIGH (ref 70–99)
Potassium: 3.8 mmol/L (ref 3.5–5.1)
Sodium: 139 mmol/L (ref 135–145)

## 2019-09-11 MED ORDER — OXYCODONE-ACETAMINOPHEN 5-325 MG PO TABS: 1.0000 | ORAL_TABLET | ORAL | 0 refills | Status: DC | PRN

## 2019-09-11 MED ORDER — IBUPROFEN 600 MG PO TABS: 600.0000 mg | ORAL_TABLET | Freq: Four times a day (QID) | ORAL | 0 refills | Status: DC | PRN

## 2019-09-11 NOTE — Discharge Summary (Signed)
Physician Discharge Summary  Patient ID: Sheila Moore MRN: [id number] DOB/AGE: 1975/11/12 44 y.o.  Admit date: 09/10/2019 Discharge date: 09/11/2019  Admission Diagnoses:  Discharge Diagnoses:  Active Problems:   Menorrhagia   Acute blood loss anemia   S/P hysterectomy   Discharged Condition: good  Hospital Course: Patient admitted for scheduled TLH, BS, cystoscopy.  Surgery complicated by extensive adhesive disease necessitating deviation to supracervical hysterectomy to avoid bladder injury.  Surgery was otherwise uneventful.  The patient was monitored overnight into POD1 and did well.  She was meeting all postoperative goals including voiding, ambulating, tolerating po, reporting good pain control on po analgesics and remained hemodynamically stable and afebrile throughout admission.  H&H postoperative was stable.  BUN/CR were stable as well  Consults: None  Significant Diagnostic Studies:  Results for orders placed or performed during the hospital encounter of 09/10/19 (from the past 72 hour(s))  Pregnancy, urine POC     Status: None   Collection Time: 09/10/19 10:28 AM  Result Value Ref Range   Preg Test, Ur NEGATIVE NEGATIVE    Comment:        THE SENSITIVITY OF THIS METHODOLOGY IS >24 mIU/mL   Type and screen [place]     Status: None   Collection Time: 09/10/19 10:34 AM  Result Value Ref Range   ABO/RH(D) O POS    Antibody Screen NEG    Sample Expiration 09/13/2019,2359    Unit Number [id number]    Blood Component Type RED CELLS,LR    Unit division 00    Status of Unit REL FROM [facility]    Transfusion Status OK TO TRANSFUSE    Crossmatch Result      Compatible Performed at [facility], [address] [place], [place] [address]    Unit Number [id number]    Blood Component Type RED CELLS,LR    Unit division 00    Status of Unit REL FROM [facility]    Transfusion Status OK TO TRANSFUSE    Crossmatch Result Compatible    Prepare RBC (crossmatch)     Status: None   Collection Time: 09/10/19 11:00 AM  Result Value Ref Range   Order Confirmation      ORDER PROCESSED BY BLOOD BANK Performed at [facility], [address] [place], [place] [address]   CBC     Status: Abnormal   Collection Time: 09/11/19  6:57 AM  Result Value Ref Range   WBC 13.3 (H) 4.0 - 10.5 K/uL   RBC 4.43 3.87 - 5.11 MIL/uL   Hemoglobin 8.1 (L) 12.0 - 15.0 g/dL    Comment: Reticulocyte Hemoglobin testing may be clinically indicated, consider ordering this additional test [id number]    HCT 29.9 (L) 36.0 - 46.0 %   MCV 67.5 (L) 80.0 - 100.0 fL   MCH 18.3 (L) 26.0 - 34.0 pg   MCHC 27.1 (L) 30.0 - 36.0 g/dL   RDW 24.3 (H) 11.5 - 15.5 %   Platelets 357 150 - 400 K/uL   nRBC 0.0 0.0 - 0.2 %    Comment: Performed at [facility], [address] [place], [place] [address]     Treatments: surgery: laparoscopic supracervical hysterectomy, bilateral salpingectomy, extensive lysis of adhesions, and cystoscopy  Discharge Exam: Blood pressure 139/80, pulse (!) 54, temperature 98.1 F (36.7 C), temperature source Oral, resp. rate 18, SpO2 99 %. General appearance: alert, appears stated age and no distress Chest wall: no increased work of breathing GI: soft,  non-tender; bowel sounds normal; no masses,  no organomegaly Extremities: extremities normal, atraumatic, no cyanosis or edema Incision/Wound:D/C/I trocar sites  Disposition: Discharge disposition: 01-Home or Self Care       Discharge Instructions    Call MD for:   Complete by: As directed    Heavy vaginal bleeding greater than 1 pad an hour   Call MD for:  difficulty breathing, headache or visual disturbances   Complete by: As directed    Call MD for:  extreme fatigue   Complete by: As directed    Call MD for:  hives   Complete by: As directed    Call MD for:  persistant dizziness or light-headedness   Complete by: As directed    Call MD for:   persistant nausea and vomiting   Complete by: As directed    Call MD for:  redness, tenderness, or signs of infection (pain, swelling, redness, odor or green/yellow discharge around incision site)   Complete by: As directed    Call MD for:  severe uncontrolled pain   Complete by: As directed    Call MD for:  temperature >100.4   Complete by: As directed    Discharge wound care:   Complete by: As directed    You may apply a light dressing for minor discharge from the incision or to keep waistbands of clothing from rubbing.    You may shower, use soap on your incision.  Avoid baths or soaking the incision in the first 6 weeks following your surgery..   Driving restriction   Complete by: As directed    Avoid driving for at least 2 weeks or while taking prescription narcotics.   Lifting restrictions   Complete by: As directed    Weight restriction of 10lbs for 6 weeks.     Allergies as of 09/11/2019   No Known Allergies     Medication List    STOP taking these medications   medroxyPROGESTERone 10 MG tablet Commonly known as: PROVERA     TAKE these medications   carbamazepine 300 MG 12 hr capsule Commonly known as: CARBATROL Take 600 mg by mouth 2 (two) times daily.   ibuprofen 600 MG tablet Commonly known as: ADVIL Take 1 tablet (600 mg total) by mouth every 6 (six) hours as needed for mild pain or cramping (pain.). What changed:   medication strength  when to take this  reasons to take this   levETIRAcetam 500 MG tablet Commonly known as: KEPPRA Take 750-1,000 mg by mouth See admin instructions. Take 2 tablets (1000 mg) by mouth in the morning & take 1.5 tablets (750 mg) by mouth at night.   oxyCODONE-acetaminophen 5-325 MG tablet Commonly known as: PERCOCET/ROXICET Take 1 tablet by mouth every 4 (four) hours as needed for moderate pain or severe pain ((when tolerating fluids)).            Discharge Care Instructions  (From admission, onward)         Start      Ordered   09/11/19 0000  Discharge wound care:    Comments: You may apply a light dressing for minor discharge from the incision or to keep waistbands of clothing from rubbing.    You may shower, use soap on your incision.  Avoid baths or soaking the incision in the first 6 weeks following your surgery.Marland Kitchen   09/11/19 0744         Follow-up Information    Malachy Mood, MD Follow up in  1 week(s).   Specialty: Obstetrics and Gynecology Why: postop Contact information: [address] [place] [address] [phone]           Signed: Malachy Mood 09/11/2019, 7:45 AM

## 2019-09-11 NOTE — Progress Notes (Signed)
Pt discharged home. Discharge instructions, prescriptions, and follow up appointments given to and reviewed with pt. Pt verbalized understanding. Escorted by axillary.

## 2019-09-14 LAB — SURGICAL PATHOLOGY

## 2019-09-17 ENCOUNTER — Other Ambulatory Visit: Payer: Self-pay

## 2019-09-17 ENCOUNTER — Encounter: Payer: Self-pay | Admitting: Obstetrics and Gynecology

## 2019-09-17 ENCOUNTER — Ambulatory Visit (INDEPENDENT_AMBULATORY_CARE_PROVIDER_SITE_OTHER): Payer: Medicare Other | Admitting: Obstetrics and Gynecology

## 2019-09-17 VITALS — BP 122/82 | Ht 66.0 in | Wt 182.0 lb

## 2019-09-17 DIAGNOSIS — Z4889 Encounter for other specified surgical aftercare: Secondary | ICD-10-CM

## 2019-09-17 NOTE — Progress Notes (Signed)
Postoperative Follow-up Patient presents post op from laparoscopic supracervical hysterectomy, bilateral salpingectomy, and cystoscopy 1weeks ago for abnormal uterine bleeding.  Subjective: Patient reports marked improvement in her preop symptoms. Eating a regular diet without difficulty. Pain well controlled with ibuprofen only  Activity: normal activities of daily living.  Objective: Blood pressure 122/82, height 5\' 6"  (1.676 m), weight 182 lb (82.6 kg).  General: NAD Pulmonary: no increased work of breathing Abdomen: soft, non-tender, non-distended, incision(s) D/C/I Extremities: no edema Neurologic: normal gait    Admission on 09/10/2019, Discharged on 09/11/2019  Component Date Value Ref Range Status  . Order Confirmation 09/10/2019    Final                   Value:ORDER PROCESSED BY BLOOD BANK Performed at [facility], [address] [place], [place] [address]   . Preg Test, Ur 09/10/2019 NEGATIVE  NEGATIVE Final   Comment:        THE SENSITIVITY OF THIS METHODOLOGY IS >24 mIU/mL   . ABO/RH(D) 09/10/2019 O POS   Final  . Antibody Screen 09/10/2019 NEG   Final  . Sample Expiration 09/10/2019 09/13/2019,2359   Final  . Unit Number 09/10/2019 [id number]   Final  . Blood Component Type 09/10/2019 RED CELLS,LR   Final  . Unit division 09/10/2019 00   Final  . Status of Unit 09/10/2019 REL FROM [facility]   Final  . Transfusion Status 09/10/2019 OK TO TRANSFUSE   Final  . Crossmatch Result 09/10/2019    Final                   Value:Compatible Performed at [facility], [address], [place],  [address]   . Unit Number 09/10/2019 [id number]   Final  . Blood Component Type 09/10/2019 RED CELLS,LR   Final  . Unit division 09/10/2019 00   Final  . Status of Unit 09/10/2019 REL FROM [facility]   Final  . Transfusion Status 09/10/2019 OK TO TRANSFUSE   Final  . Crossmatch Result 09/10/2019 Compatible   Final  . Blood Product Unit Number  09/10/2019 [id number]   Final  . PRODUCT CODE 09/10/2019 [id number]   Final  . Unit Type and Rh 09/10/2019 5100   Final  . Blood Product Expiration Date 09/10/2019 [id number]   Final  . Blood Product Unit Number 09/10/2019 [id number]   Final  . PRODUCT CODE 09/10/2019 [id number]   Final  . Unit Type and Rh 09/10/2019 5100   Final  . Blood Product Expiration Date 09/10/2019 [id number]   Final  . SURGICAL PATHOLOGY 09/10/2019    Final-Edited                   Value:SURGICAL PATHOLOGY CASE: [id number] PATIENT: Genoveva Ill Surgical Pathology Report     Specimen Submitted: A. Uterus without cervix, bilateral tubes  Clinical History: Abnormal uterine bleeding N93.9      DIAGNOSIS: A. UTERUS WITHOUT CERVIX, BILATERAL TUBES; SUPRACERVICAL HYSTERECTOMY WITH BILATERAL SALPINGECTOMY: - ENDOMETRIUM:      - PROLIFERATIVE, WITH MILD DISORDER. - NEGATIVE FOR ATYPIA / EIN AND MALIGNANCY. - MYOMETRIUM:      - LEIOMYOMA. - FALLOPIAN TUBES:      - DENSE FIBROUS ADHESIONS. - ENDOMETRIOSIS. - NEGATIVE FOR MALIGNANCY.  GROSS DESCRIPTION: A. Labeled: Uterus without cervix, bilateral tubes Received: Formalin Weight: 180 grams (total; post formalin fixation) Dimensions: Morcellated, aggregate measurement of 13.5 x 9.5 x 3.0 cm. Serosa: The intact uterine serosa  is tan and smooth. Cervix: Surgically absent Endometrium:      Thickness - 0.1 cm (uniform)      Other findings - The intact fragments of endometrium demonstrate no po                         lyps or abnormalities. Myometrium:     Other findings - Sectioning displays multiple well-circumscribed, intramural and subserosal nodules ranging from 0.4 to 1.5 cm.  The nodules display a pale-tan, whorled cut surface. Adnexa:      Fallopian tube           Measurements - 5.5 cm in length x 0.6 cm in diameter           Other findings - Received detached.  The fimbria displays a 1.2 x 1.0 x 0.5 cm focal area of pale-tan  discoloration and nodularity. The external surface is tan-purple and smooth.  Sectioning displays a pinpoint, patent lumen.      Opposite, possible fallopian tube            Measurements - 1.5 x 0.9 x 0.6 cm           Other findings - Received detached, fragmented, disrupted.  Block summary: 1 - uterine serosa 2-8 - entire, possible endometrium 9-11 - myometrial nodules 12-13 - entire fallopian tube with longitudinally sectioned fimbria and cross-sections (irregular fimbria in cassette 12) 14 - entire, opposite possible fallopian tube parenchyma                           Final Diagnosis performed by Betsy Pries, MD.   Electronically signed 09/14/2019 3:16:02PM The electronic signature indicates that the named Attending Pathologist has evaluated the specimen Technical component performed at [facility], [address] [place], [place] [address] Lab: [phone] Dir: Rush Farmer, MD, MMM  Professional component performed at [facility], [facility], [address], [place], [place] [address] Lab: [id number] Dir: Dellia Nims. Rubinas, MD   . WBC 09/11/2019 13.3* 4.0 - 10.5 K/uL Final  . RBC 09/11/2019 4.43  3.87 - 5.11 MIL/uL Final  . Hemoglobin 09/11/2019 8.1* 12.0 - 15.0 g/dL Final   Comment: Reticulocyte Hemoglobin testing may be clinically indicated, consider ordering this additional test [id number]   . HCT 09/11/2019 29.9* 36.0 - 46.0 % Final  . MCV 09/11/2019 67.5* 80.0 - 100.0 fL Final  . MCH 09/11/2019 18.3* 26.0 - 34.0 pg Final  . MCHC 09/11/2019 27.1* 30.0 - 36.0 g/dL Final  . RDW 09/11/2019 24.3* 11.5 - 15.5 % Final  . Platelets 09/11/2019 357  150 - 400 K/uL Final  . nRBC 09/11/2019 0.0  0.0 - 0.2 % Final   Performed at [facility], [address], [place],  [address]  . Sodium 09/11/2019 139  135 - 145 mmol/L Final  . Potassium 09/11/2019 3.8  3.5 - 5.1 mmol/L Final  . Chloride 09/11/2019 105  98 - 111 mmol/L Final  . CO2 09/11/2019  26  22 - 32 mmol/L Final  . Glucose, Bld 09/11/2019 124* 70 - 99 mg/dL Final   Glucose reference range applies only to samples taken after fasting for at least 8 hours.  . BUN 09/11/2019 8  6 - 20 mg/dL Final  . Creatinine, Ser 09/11/2019 0.84  0.44 - 1.00 mg/dL Final  . Calcium 09/11/2019 8.9  8.9 - 10.3 mg/dL Final  . GFR calc non Af Amer 09/11/2019 >60  >  60 mL/min Final  . GFR calc Af Amer 09/11/2019 >60  >60 mL/min Final  . Anion gap 09/11/2019 8  5 - 15 Final   Performed at [facility], [place], [place] [address]    Assessment: 44 y.o. s/p laparoscopic supracervical hysterectomy, bilateral salpingectomy, and cystoscopy stable  Plan: Patient has done well after surgery with no apparent complications.  I have discussed the post-operative course to date, and the expected progress moving forward.  The patient understands what complications to be concerned about.  I will see the patient in routine follow up, or sooner if needed.    Activity plan: No heavy lifting.   Malachy Mood, MD, [facility], [facility] 09/17/2019, 10:35 AM

## 2019-10-20 DIAGNOSIS — R292 Abnormal reflex: Secondary | ICD-10-CM | POA: Diagnosis not present

## 2019-10-20 DIAGNOSIS — R42 Dizziness and giddiness: Secondary | ICD-10-CM | POA: Diagnosis not present

## 2019-10-20 DIAGNOSIS — Z85841 Personal history of malignant neoplasm of brain: Secondary | ICD-10-CM | POA: Diagnosis not present

## 2019-10-20 DIAGNOSIS — Z862 Personal history of diseases of the blood and blood-forming organs and certain disorders involving the immune mechanism: Secondary | ICD-10-CM | POA: Diagnosis not present

## 2019-10-20 DIAGNOSIS — R262 Difficulty in walking, not elsewhere classified: Secondary | ICD-10-CM | POA: Diagnosis not present

## 2019-10-20 DIAGNOSIS — R202 Paresthesia of skin: Secondary | ICD-10-CM | POA: Diagnosis not present

## 2019-10-20 DIAGNOSIS — M542 Cervicalgia: Secondary | ICD-10-CM | POA: Diagnosis not present

## 2019-10-20 DIAGNOSIS — R519 Headache, unspecified: Secondary | ICD-10-CM | POA: Diagnosis not present

## 2019-10-20 DIAGNOSIS — Z9889 Other specified postprocedural states: Secondary | ICD-10-CM | POA: Diagnosis not present

## 2019-10-30 ENCOUNTER — Encounter: Payer: Self-pay | Admitting: Obstetrics and Gynecology

## 2019-10-30 ENCOUNTER — Other Ambulatory Visit: Payer: Self-pay

## 2019-10-30 ENCOUNTER — Ambulatory Visit (INDEPENDENT_AMBULATORY_CARE_PROVIDER_SITE_OTHER): Payer: Medicare Other | Admitting: Obstetrics and Gynecology

## 2019-10-30 VITALS — BP 122/76 | Ht 65.0 in | Wt 181.0 lb

## 2019-10-30 DIAGNOSIS — Z4889 Encounter for other specified surgical aftercare: Secondary | ICD-10-CM

## 2019-10-30 NOTE — Progress Notes (Signed)
Postoperative Follow-up Patient presents post op from laproscopic supracervical hysterectomy 6weeks ago for abnormal uterine bleeding.  Subjective: Patient reports marked improvement in her preop symptoms. Eating a regular diet without difficulty. The patient is not having any pain.  Activity: normal activities of daily living.  Objective: Blood pressure 122/76, height 5\' 5"  (1.651 m), weight 181 lb (82.1 kg), last menstrual period 06/01/2019.  General: NAD Pulmonary: no increased work of breathing Abdomen: soft, non-tender, non-distended, incision(s) D/C/I GU: normal external female genitalia, normal cervix, surgically absent uterus Extremities: no edema Neurologic: normal gait    Admission on 09/10/2019, Discharged on 09/11/2019  Component Date Value Ref Range Status  . Order Confirmation 09/10/2019    Final                   Value:ORDER PROCESSED BY BLOOD BANK Performed at [facility], [address] [place], [place] [address]   . Preg Test, Ur 09/10/2019 NEGATIVE  NEGATIVE Final   Comment:        THE SENSITIVITY OF THIS METHODOLOGY IS >24 mIU/mL   . ABO/RH(D) 09/10/2019 O POS   Final  . Antibody Screen 09/10/2019 NEG   Final  . Sample Expiration 09/10/2019 09/13/2019,2359   Final  . Unit Number 09/10/2019 [id number]   Final  . Blood Component Type 09/10/2019 RED CELLS,LR   Final  . Unit division 09/10/2019 00   Final  . Status of Unit 09/10/2019 REL FROM [facility]   Final  . Transfusion Status 09/10/2019 OK TO TRANSFUSE   Final  . Crossmatch Result 09/10/2019    Final                   Value:Compatible Performed at [facility], [address] [place], [place] [address]   . Unit Number 09/10/2019 [id number]   Final  . Blood Component Type 09/10/2019 RED CELLS,LR   Final  . Unit division 09/10/2019 00   Final  . Status of Unit 09/10/2019 REL FROM [facility]   Final  . Transfusion Status 09/10/2019 OK TO TRANSFUSE   Final  . Crossmatch  Result 09/10/2019 Compatible   Final  . Blood Product Unit Number 09/10/2019 [id number]   Final  . PRODUCT CODE 09/10/2019 [id number]   Final  . Unit Type and Rh 09/10/2019 5100   Final  . Blood Product Expiration Date 09/10/2019 [id number]   Final  . Blood Product Unit Number 09/10/2019 [id number]   Final  . PRODUCT CODE 09/10/2019 [id number]   Final  . Unit Type and Rh 09/10/2019 5100   Final  . Blood Product Expiration Date 09/10/2019 [id number]   Final  . SURGICAL PATHOLOGY 09/10/2019    Final-Edited                   Value:SURGICAL PATHOLOGY CASE: [id number] PATIENT: Genoveva Ill Surgical Pathology Report     Specimen Submitted: A. Uterus without cervix, bilateral tubes  Clinical History: Abnormal uterine bleeding N93.9      DIAGNOSIS: A. UTERUS WITHOUT CERVIX, BILATERAL TUBES; SUPRACERVICAL HYSTERECTOMY WITH BILATERAL SALPINGECTOMY: - ENDOMETRIUM:      - PROLIFERATIVE, WITH MILD DISORDER. - NEGATIVE FOR ATYPIA / EIN AND MALIGNANCY. - MYOMETRIUM:      - LEIOMYOMA. - FALLOPIAN TUBES:      - DENSE FIBROUS ADHESIONS. - ENDOMETRIOSIS. - NEGATIVE FOR MALIGNANCY.  GROSS DESCRIPTION: A. Labeled: Uterus without cervix, bilateral tubes Received: Formalin Weight: 180 grams (total; post formalin fixation) Dimensions: Morcellated, aggregate measurement of  13.5 x 9.5 x 3.0 cm. Serosa: The intact uterine serosa is tan and smooth. Cervix: Surgically absent Endometrium:      Thickness - 0.1 cm (uniform)      Other findings - The intact fragments of endometrium demonstrate no po                         lyps or abnormalities. Myometrium:     Other findings - Sectioning displays multiple well-circumscribed, intramural and subserosal nodules ranging from 0.4 to 1.5 cm.  The nodules display a pale-tan, whorled cut surface. Adnexa:      Fallopian tube           Measurements - 5.5 cm in length x 0.6 cm in diameter           Other findings - Received detached.  The  fimbria displays a 1.2 x 1.0 x 0.5 cm focal area of pale-tan discoloration and nodularity. The external surface is tan-purple and smooth.  Sectioning displays a pinpoint, patent lumen.      Opposite, possible fallopian tube            Measurements - 1.5 x 0.9 x 0.6 cm           Other findings - Received detached, fragmented, disrupted.  Block summary: 1 - uterine serosa 2-8 - entire, possible endometrium 9-11 - myometrial nodules 12-13 - entire fallopian tube with longitudinally sectioned fimbria and cross-sections (irregular fimbria in cassette 12) 14 - entire, opposite possible fallopian tube parenchyma                           Final Diagnosis performed by Betsy Pries, MD.   Electronically signed 09/14/2019 3:16:02PM The electronic signature indicates that the named Attending Pathologist has evaluated the specimen Technical component performed at [facility], [address], [place], [place] [address] Lab: [phone] Dir: Rush Farmer, MD, MMM  Professional component performed at [facility], [place], [place], [place] [address] Lab: [phone] Dir: Dellia Nims. Rubinas, MD   . WBC 09/11/2019 13.3* 4.0 - 10.5 K/uL Final  . RBC 09/11/2019 4.43  3.87 - 5.11 MIL/uL Final  . Hemoglobin 09/11/2019 8.1* 12.0 - 15.0 g/dL Final   Comment: Reticulocyte Hemoglobin testing may be clinically indicated, consider ordering this additional test [id number]   . HCT 09/11/2019 29.9* 36 - 46 % Final  . MCV 09/11/2019 67.5* 80.0 - 100.0 fL Final  . MCH 09/11/2019 18.3* 26.0 - 34.0 pg Final  . MCHC 09/11/2019 27.1* 30.0 - 36.0 g/dL Final  . RDW 09/11/2019 24.3* 11.5 - 15.5 % Final  . Platelets 09/11/2019 357  150 - 400 K/uL Final  . nRBC 09/11/2019 0.0  0.0 - 0.2 % Final   Performed at [facility], [address], [place], [place] [address]  . Sodium 09/11/2019 139  135 - 145 mmol/L Final  . Potassium 09/11/2019 3.8  3.5 - 5.1 mmol/L Final  . Chloride  09/11/2019 105  98 - 111 mmol/L Final  . CO2 09/11/2019 26  22 - 32 mmol/L Final  . Glucose, Bld 09/11/2019 124* 70 - 99 mg/dL Final   Glucose reference range applies only to samples taken after fasting for at least 8 hours.  . BUN 09/11/2019 8  6 - 20 mg/dL Final  . Creatinine, Ser 09/11/2019 0.84  0.44 - 1.00 mg/dL Final  . Calcium 09/11/2019 8.9  8.9 - 10.3 mg/dL  Final  . GFR calc non Af Amer 09/11/2019 >60  >60 mL/min Final  . GFR calc Af Amer 09/11/2019 >60  >60 mL/min Final  . Anion gap 09/11/2019 8  5 - 15 Final   Performed at [facility], [address] [place], [place] [address]    Assessment: 44 y.o. s/p laparoscopic supracervical hysterectomy, BS, cystoscopy stable  Plan: Patient has done well after surgery with no apparent complications.  I have discussed the post-operative course to date, and the expected progress moving forward.  The patient understands what complications to be concerned about.  I will see the patient in routine follow up, or sooner if needed.    Activity plan: No restriction.   Malachy Mood, MD, [facility], [facility] 10/30/2019, 8:11 AM

## 2019-10-31 DIAGNOSIS — R200 Anesthesia of skin: Secondary | ICD-10-CM | POA: Diagnosis not present

## 2019-10-31 DIAGNOSIS — M47812 Spondylosis without myelopathy or radiculopathy, cervical region: Secondary | ICD-10-CM | POA: Diagnosis not present

## 2019-10-31 DIAGNOSIS — G9689 Other specified disorders of central nervous system: Secondary | ICD-10-CM | POA: Diagnosis not present

## 2019-10-31 DIAGNOSIS — G9589 Other specified diseases of spinal cord: Secondary | ICD-10-CM | POA: Diagnosis not present

## 2019-10-31 DIAGNOSIS — M542 Cervicalgia: Secondary | ICD-10-CM | POA: Diagnosis not present

## 2019-10-31 DIAGNOSIS — G9389 Other specified disorders of brain: Secondary | ICD-10-CM | POA: Diagnosis not present

## 2019-11-02 ENCOUNTER — Other Ambulatory Visit: Payer: Self-pay | Admitting: Obstetrics and Gynecology

## 2019-11-02 ENCOUNTER — Telehealth: Payer: Self-pay

## 2019-11-02 MED ORDER — FLUCONAZOLE 150 MG PO TABS: 150.0000 mg | ORAL_TABLET | Freq: Once | ORAL | 0 refills | Status: AC

## 2019-11-02 NOTE — Telephone Encounter (Signed)
Advise

## 2019-11-02 NOTE — Telephone Encounter (Signed)
Put in an Rx for diflucan

## 2019-11-02 NOTE — Telephone Encounter (Signed)
Pt calling; was seen Friday; rx is not at pharmacy.  [phone]

## 2019-11-12 DIAGNOSIS — C719 Malignant neoplasm of brain, unspecified: Secondary | ICD-10-CM | POA: Diagnosis not present

## 2019-11-12 DIAGNOSIS — C7951 Secondary malignant neoplasm of bone: Secondary | ICD-10-CM | POA: Diagnosis not present

## 2019-11-12 DIAGNOSIS — R569 Unspecified convulsions: Secondary | ICD-10-CM | POA: Diagnosis not present

## 2019-11-12 DIAGNOSIS — R202 Paresthesia of skin: Secondary | ICD-10-CM | POA: Diagnosis not present

## 2019-11-12 DIAGNOSIS — Z9889 Other specified postprocedural states: Secondary | ICD-10-CM | POA: Diagnosis not present

## 2019-11-12 DIAGNOSIS — R519 Headache, unspecified: Secondary | ICD-10-CM | POA: Diagnosis not present

## 2019-11-12 DIAGNOSIS — R937 Abnormal findings on diagnostic imaging of other parts of musculoskeletal system: Secondary | ICD-10-CM | POA: Diagnosis not present

## 2019-11-12 DIAGNOSIS — R262 Difficulty in walking, not elsewhere classified: Secondary | ICD-10-CM | POA: Diagnosis not present

## 2019-11-12 DIAGNOSIS — R42 Dizziness and giddiness: Secondary | ICD-10-CM | POA: Diagnosis not present

## 2019-11-27 DIAGNOSIS — M79662 Pain in left lower leg: Secondary | ICD-10-CM | POA: Diagnosis not present

## 2019-11-27 DIAGNOSIS — D509 Iron deficiency anemia, unspecified: Secondary | ICD-10-CM | POA: Diagnosis not present

## 2019-11-27 DIAGNOSIS — Z823 Family history of stroke: Secondary | ICD-10-CM | POA: Diagnosis not present

## 2019-11-27 DIAGNOSIS — M5418 Radiculopathy, sacral and sacrococcygeal region: Secondary | ICD-10-CM | POA: Diagnosis not present

## 2019-11-27 DIAGNOSIS — Z4801 Encounter for change or removal of surgical wound dressing: Secondary | ICD-10-CM | POA: Diagnosis not present

## 2019-11-27 DIAGNOSIS — Z79899 Other long term (current) drug therapy: Secondary | ICD-10-CM | POA: Diagnosis not present

## 2019-11-27 DIAGNOSIS — C719 Malignant neoplasm of brain, unspecified: Secondary | ICD-10-CM | POA: Diagnosis not present

## 2019-11-27 DIAGNOSIS — R569 Unspecified convulsions: Secondary | ICD-10-CM | POA: Diagnosis not present

## 2019-11-27 DIAGNOSIS — G96198 Other disorders of meninges, not elsewhere classified: Secondary | ICD-10-CM | POA: Diagnosis not present

## 2019-11-27 DIAGNOSIS — Z8249 Family history of ischemic heart disease and other diseases of the circulatory system: Secondary | ICD-10-CM | POA: Diagnosis not present

## 2019-11-27 DIAGNOSIS — R262 Difficulty in walking, not elsewhere classified: Secondary | ICD-10-CM | POA: Diagnosis not present

## 2019-11-27 DIAGNOSIS — M79605 Pain in left leg: Secondary | ICD-10-CM | POA: Diagnosis present

## 2019-11-27 DIAGNOSIS — M5126 Other intervertebral disc displacement, lumbar region: Secondary | ICD-10-CM | POA: Diagnosis not present

## 2019-11-27 DIAGNOSIS — M542 Cervicalgia: Secondary | ICD-10-CM | POA: Diagnosis not present

## 2019-11-27 DIAGNOSIS — R202 Paresthesia of skin: Secondary | ICD-10-CM | POA: Diagnosis not present

## 2019-11-27 DIAGNOSIS — D334 Benign neoplasm of spinal cord: Secondary | ICD-10-CM | POA: Diagnosis not present

## 2019-11-27 DIAGNOSIS — C7949 Secondary malignant neoplasm of other parts of nervous system: Secondary | ICD-10-CM | POA: Diagnosis not present

## 2019-11-27 DIAGNOSIS — Z01818 Encounter for other preprocedural examination: Secondary | ICD-10-CM | POA: Diagnosis not present

## 2019-11-27 DIAGNOSIS — Z48811 Encounter for surgical aftercare following surgery on the nervous system: Secondary | ICD-10-CM | POA: Diagnosis not present

## 2019-11-27 DIAGNOSIS — R200 Anesthesia of skin: Secondary | ICD-10-CM | POA: Diagnosis not present

## 2019-11-27 DIAGNOSIS — C720 Malignant neoplasm of spinal cord: Secondary | ICD-10-CM | POA: Diagnosis not present

## 2019-11-27 DIAGNOSIS — G9782 Other postprocedural complications and disorders of nervous system: Secondary | ICD-10-CM | POA: Diagnosis not present

## 2019-11-27 DIAGNOSIS — G9589 Other specified diseases of spinal cord: Secondary | ICD-10-CM | POA: Diagnosis not present

## 2019-11-27 DIAGNOSIS — Z85841 Personal history of malignant neoplasm of brain: Secondary | ICD-10-CM | POA: Diagnosis not present

## 2019-11-27 DIAGNOSIS — D496 Neoplasm of unspecified behavior of brain: Secondary | ICD-10-CM | POA: Diagnosis not present

## 2019-11-27 DIAGNOSIS — Z9889 Other specified postprocedural states: Secondary | ICD-10-CM | POA: Diagnosis not present

## 2019-11-27 DIAGNOSIS — M47816 Spondylosis without myelopathy or radiculopathy, lumbar region: Secondary | ICD-10-CM | POA: Diagnosis not present

## 2019-11-27 DIAGNOSIS — M62838 Other muscle spasm: Secondary | ICD-10-CM | POA: Diagnosis not present

## 2019-11-27 DIAGNOSIS — Z833 Family history of diabetes mellitus: Secondary | ICD-10-CM | POA: Diagnosis not present

## 2019-11-27 DIAGNOSIS — M47817 Spondylosis without myelopathy or radiculopathy, lumbosacral region: Secondary | ICD-10-CM | POA: Diagnosis not present

## 2019-11-30 HISTORY — PX: OTHER SURGICAL HISTORY: SHX169

## 2019-12-07 ENCOUNTER — Emergency Department: Payer: Medicare Other

## 2019-12-07 ENCOUNTER — Other Ambulatory Visit: Payer: Self-pay

## 2019-12-07 ENCOUNTER — Emergency Department
Admission: EM | Admit: 2019-12-07 | Discharge: 2019-12-07 | Disposition: A | Discharge status: Other Acute Inpatient Hospital | Payer: Medicare Other | Attending: Emergency Medicine | Admitting: Emergency Medicine

## 2019-12-07 DIAGNOSIS — R202 Paresthesia of skin: Secondary | ICD-10-CM | POA: Diagnosis not present

## 2019-12-07 DIAGNOSIS — Z4801 Encounter for change or removal of surgical wound dressing: Secondary | ICD-10-CM | POA: Diagnosis not present

## 2019-12-07 DIAGNOSIS — Z4889 Encounter for other specified surgical aftercare: Secondary | ICD-10-CM

## 2019-12-07 DIAGNOSIS — Z48811 Encounter for surgical aftercare following surgery on the nervous system: Secondary | ICD-10-CM | POA: Insufficient documentation

## 2019-12-07 DIAGNOSIS — M5126 Other intervertebral disc displacement, lumbar region: Secondary | ICD-10-CM | POA: Diagnosis not present

## 2019-12-07 DIAGNOSIS — M47816 Spondylosis without myelopathy or radiculopathy, lumbar region: Secondary | ICD-10-CM | POA: Diagnosis not present

## 2019-12-07 DIAGNOSIS — M79662 Pain in left lower leg: Secondary | ICD-10-CM | POA: Diagnosis not present

## 2019-12-07 DIAGNOSIS — C719 Malignant neoplasm of brain, unspecified: Secondary | ICD-10-CM | POA: Diagnosis not present

## 2019-12-07 DIAGNOSIS — M47817 Spondylosis without myelopathy or radiculopathy, lumbosacral region: Secondary | ICD-10-CM | POA: Diagnosis not present

## 2019-12-07 DIAGNOSIS — G9589 Other specified diseases of spinal cord: Secondary | ICD-10-CM | POA: Diagnosis not present

## 2019-12-07 LAB — LACTIC ACID, PLASMA: Lactic Acid, Venous: 1.4 mmol/L (ref 0.5–1.9)

## 2019-12-07 LAB — BASIC METABOLIC PANEL
Anion gap: 9 (ref 5–15)
BUN: 11 mg/dL (ref 6–20)
CO2: 28 mmol/L (ref 22–32)
Calcium: 9.4 mg/dL (ref 8.9–10.3)
Chloride: 101 mmol/L (ref 98–111)
Creatinine, Ser: 0.77 mg/dL (ref 0.44–1.00)
GFR calc Af Amer: >60 mL/min (ref >60)
GFR calc non Af Amer: >60 mL/min (ref >60)
Glucose, Bld: 96 mg/dL (ref 70–99)
Potassium: 3.6 mmol/L (ref 3.5–5.1)
Sodium: 138 mmol/L (ref 135–145)

## 2019-12-07 LAB — CBC WITH DIFFERENTIAL/PLATELET
Abs Immature Granulocytes: 0.11 10*3/uL — ABNORMAL HIGH (ref 0.00–0.07)
Basophils Absolute: 0.1 10*3/uL (ref 0.0–0.1)
Basophils Relative: 0 %
Eosinophils Absolute: 0.4 10*3/uL (ref 0.0–0.5)
Eosinophils Relative: 3 %
HCT: 36.1 % (ref 36.0–46.0)
Hemoglobin: 11.6 g/dL — ABNORMAL LOW (ref 12.0–15.0)
Immature Granulocytes: 1 %
Lymphocytes Relative: 26 %
Lymphs Abs: 4.2 10*3/uL — ABNORMAL HIGH (ref 0.7–4.0)
MCH: 23.9 pg — ABNORMAL LOW (ref 26.0–34.0)
MCHC: 32.1 g/dL (ref 30.0–36.0)
MCV: 74.4 fL — ABNORMAL LOW (ref 80.0–100.0)
Monocytes Absolute: 1.1 10*3/uL — ABNORMAL HIGH (ref 0.1–1.0)
Monocytes Relative: 7 %
Neutro Abs: 10.5 10*3/uL — ABNORMAL HIGH (ref 1.7–7.7)
Neutrophils Relative %: 63 %
Platelets: 374 10*3/uL (ref 150–400)
RBC: 4.85 MIL/uL (ref 3.87–5.11)
RDW: 18.4 % — ABNORMAL HIGH (ref 11.5–15.5)
WBC: 16.4 10*3/uL — ABNORMAL HIGH (ref 4.0–10.5)
nRBC: 0 % (ref 0.0–0.2)

## 2019-12-07 NOTE — ED Notes (Signed)
See triage note states she had back surgery for a cyst on spine  States she was doing fine until last pm  States she noticed some drainage   Also having shooting pain into left leg

## 2019-12-07 NOTE — ED Notes (Signed)
[facility] called for Dr Archie Balboa.  Pending call back

## 2019-12-07 NOTE — ED Provider Notes (Signed)
[facility] Emergency Department Provider Note  ____________________________________________   I have reviewed the triage vital signs and the nursing notes.   HISTORY  Chief Complaint Back Pain, Leg Pain, and wound check   History limited by: Not Limited   HPI Sheila Moore is a 44 y.o. female who presents to the emergency department today because of concerns for left leg pain as well as drainage from recent surgical site.  Patient states about a week ago she had back surgery for a tumor.  She states starting 2 days ago she developed shooting pains down her left leg.  It starts around her buttock and goes behind her leg.  It occurs anytime she tries to move it.  Last night she felt a some drainage from her surgical site.  She states that she has not noticed the drainage today.  She has not had any fevers with this.  She denies any nausea or vomiting.  Records reviewed. Per medical record review patient has a history of lumbar laminectomy of L4-L5 for tumor removal one week ago at [facility].  Past Medical History:  Diagnosis Date  . Blood transfusion without reported diagnosis   . Brain tumor (benign) ([place])   . Seizures [facility])     Patient Active Problem List   Diagnosis Date Noted  . S/P hysterectomy 09/10/2019  . Acute blood loss anemia 09/08/2019  . Menorrhagia 05/23/2016  . Anemia 05/23/2016  . Nipple discharge 11/06/2012    Past Surgical History:  Procedure Laterality Date  . BRAIN SURGERY  2010   tumor removed  . CESAREAN SECTION    . CYSTOSCOPY  09/10/2019   Procedure: CYSTOSCOPY;  Surgeon: Malachy Mood, MD;  Location: [facility] ORS;  Service: Gynecology;;  . LAPAROSCOPIC SUPRACERVICAL HYSTERECTOMY Bilateral 09/10/2019   Procedure: TOTAL LAPAROSCOPIC HYSTERECTOMY WITH BILATERAL SALPINGECTOMY;  Surgeon: Malachy Mood, MD;  Location: [facility] ORS;  Service: Gynecology;  Laterality: Bilateral;  . MUSCLE BIOPSY     to check for maligmnant  hyperthermia    Prior to Admission medications   Medication Sig Start Date End Date Taking? Authorizing Provider  carbamazepine (CARBATROL) 300 MG 12 hr capsule Take 600 mg by mouth 2 (two) times daily. 09/18/18  Yes [provider]  cyclobenzaprine (FLEXERIL) 10 MG tablet Take 10 mg by mouth 3 (three) times daily. 12/02/19  Yes [provider]  HYDROcodone-acetaminophen (NORCO/VICODIN) 5-325 MG tablet Take 1-2 tablets by mouth every 6 (six) hours as needed for severe pain. 12/02/19  Yes [provider]  levETIRAcetam (KEPPRA) 500 MG tablet Take 750-1,000 mg by mouth See admin instructions. Take 2 tablets (1000 mg) by mouth in the morning and take 1 tablets (750 mg) by mouth at night. 09/18/18  Yes [provider]    Allergies Patient has no known allergies.  Family History  Problem Relation Age of Onset  . Hypertension Mother   . COPD Mother     Social History Social History   Tobacco Use  . Smoking status: Never Smoker  . Smokeless tobacco: Never Used  Vaping Use  . Vaping Use: Never used  Substance Use Topics  . Alcohol use: No  . Drug use: No    Review of Systems Constitutional: No fever/chills Eyes: No visual changes. ENT: No sore throat. Cardiovascular: Denies chest pain. Respiratory: Denies shortness of breath. Gastrointestinal: No abdominal pain.  No nausea, no vomiting.  No diarrhea.   Genitourinary: Negative for dysuria. Musculoskeletal: Positive for left leg pain. Skin: Positive for  draining from surgical wound Neurological: Negative for headaches, focal weakness or numbness.  ____________________________________________   PHYSICAL EXAM:  VITAL SIGNS: ED Triage Vitals  Enc Vitals Group     BP 12/07/19 1441 (!) 137/109     Pulse Rate 12/07/19 1441 94     Resp 12/07/19 1441 17     Temp 12/07/19 1441 98.9 F (37.2 C)     Temp src --      SpO2 12/07/19 1441 99 %     Weight 12/07/19 1440 185 lb (83.9 kg)     Height  12/07/19 1440 5\' 5"  (1.651 m)     Head Circumference --      Peak Flow --      Pain Score 12/07/19 1440 5     Pain Loc --      Pain Edu? --      Excl. in [place]? --      Constitutional: Alert and oriented.  Eyes: Conjunctivae are normal.  ENT      Head: Normocephalic and atraumatic.      Nose: No congestion/rhinnorhea.      Mouth/Throat: Mucous membranes are moist.      Neck: No stridor. Hematological/Lymphatic/Immunilogical: No cervical lymphadenopathy. Cardiovascular: Normal rate, regular rhythm.  No murmurs, rubs, or gallops.  Respiratory: Normal respiratory effort without tachypnea nor retractions. Breath sounds are clear and equal bilaterally. No wheezes/rales/rhonchi. Gastrointestinal: Soft and non tender. No rebound. No guarding.  Genitourinary: Deferred Musculoskeletal: Normal range of motion in all extremities. No lower extremity edema. Neurologic:  Normal speech and language. No gross focal neurologic deficits are appreciated.  Skin:  Vertical surgical incision over lumbar spine. No erythema. No discharge. No warmth.  Psychiatric: Mood and affect are normal. Speech and behavior are normal. Patient exhibits appropriate insight and judgment.  ____________________________________________    LABS (pertinent positives/negatives)  CBC wbc 16.4, hgb 11.6, plt 374 BMP wnl Lactic acid 1.4 ____________________________________________   EKG  None  ____________________________________________    RADIOLOGY  MRI Fluid collection in both spinal canal and subcutaneous tissue concerning for CSF leak. Effacement of thecal sac  I, Nance Pear, personally discussed these images and results by phone with the on-call radiologist and used this discussion as part of my medical decision making.   ____________________________________________   PROCEDURES  Procedures  ____________________________________________   INITIAL IMPRESSION / ASSESSMENT AND PLAN / ED  COURSE  Pertinent labs & imaging results that were available during my care of the patient were reviewed by me and considered in my medical decision making (see chart for details).   Patient presented to the emergency department with complaints for left leg pain as well as fluid leak from wound site of recent laminectomy.  On exam patient's wound without any obvious drainage at this time.  No surrounding erythema.  No warmth.  Did obtain an MRI which was concerning for fluid collection both in the spinal canal as well as the subcutaneous tissues concern for possible CSF leak.  There was some effacement of the thecal sac and nerve root as well.  Discussed these findings with Dr. Bridgette Habermann with neurosurgery at [facility].  At this time will plan on transferring patient over to their facility.  I discussed plan with patient.  She did want to go private vehicle.  Did discuss that we could arrange ambulance transport however she would like to go private vehicle.  At this time I do not think that is completely unreasonable given the patient is not on any infusions  and is otherwise stable. ____________________________________________   FINAL CLINICAL IMPRESSION(S) / ED DIAGNOSES  Final diagnoses:  Encounter for post surgical wound check     Note: This dictation was prepared with Dragon dictation. Any transcriptional errors that result from this process are unintentional     Nance Pear, MD 12/07/19 1925

## 2019-12-07 NOTE — Discharge Instructions (Addendum)
Please go directly to [facility] emergency department.

## 2019-12-07 NOTE — ED Notes (Signed)
Per Dr. Archie Balboa patient is having family member drive her by private vehicle to [facility] ED  called Lurena Joiner at [facility] and informed him of this  [phone]

## 2019-12-07 NOTE — ED Notes (Signed)
Pt moved to room 4  Report given to [facility]

## 2019-12-07 NOTE — ED Notes (Signed)
Informed pt to go straight to [facility] ED. Report called to Agricultural consultant at [facility]. Informed pt not to open up envelope and give them to staff at [facility]. Pt verbalized understanding at this time.

## 2019-12-07 NOTE — ED Notes (Signed)
Images POWERSHARED to [facility] by [place]

## 2019-12-07 NOTE — ED Triage Notes (Signed)
Pt comes via POV from home with c/o sharp shooting pain in back that runs down left leg. Pt states also drainage from incision on back. Pt states it was packed but the other night she noticed it was shooting drainage.  No drainage at this time. No fever or chills.

## 2019-12-12 LAB — CULTURE, BLOOD (ROUTINE X 2)
Culture: NO GROWTH
Culture: NO GROWTH
Special Requests: ADEQUATE

## 2019-12-22 ENCOUNTER — Encounter: Payer: Self-pay | Admitting: Obstetrics and Gynecology

## 2019-12-22 ENCOUNTER — Ambulatory Visit (INDEPENDENT_AMBULATORY_CARE_PROVIDER_SITE_OTHER): Payer: Medicare Other | Admitting: Obstetrics and Gynecology

## 2019-12-22 ENCOUNTER — Telehealth: Payer: Self-pay

## 2019-12-22 ENCOUNTER — Other Ambulatory Visit: Payer: Self-pay

## 2019-12-22 VITALS — BP 110/80 | Ht 65.0 in | Wt 178.0 lb

## 2019-12-22 DIAGNOSIS — N898 Other specified noninflammatory disorders of vagina: Secondary | ICD-10-CM | POA: Diagnosis not present

## 2019-12-22 DIAGNOSIS — Z4789 Encounter for other orthopedic aftercare: Secondary | ICD-10-CM | POA: Diagnosis not present

## 2019-12-22 LAB — POCT WET PREP WITH KOH
Clue Cells Wet Prep HPF POC: NEGATIVE
KOH Prep POC: NEGATIVE
Trichomonas, UA: NEGATIVE
Yeast Wet Prep HPF POC: NEGATIVE

## 2019-12-22 MED ORDER — METRONIDAZOLE 500 MG PO TABS: 500.0000 mg | ORAL_TABLET | Freq: Two times a day (BID) | ORAL | 0 refills | Status: AC

## 2019-12-22 NOTE — Telephone Encounter (Signed)
Patient is scheduled for 12/22/19.

## 2019-12-22 NOTE — Telephone Encounter (Signed)
Pt calling; thinks BV has come back.  [phone]

## 2019-12-22 NOTE — Progress Notes (Signed)
Patient, No Pcp Per   Chief Complaint  Patient presents with  . Vaginal Discharge    fishy odor, no itchiness or irritation x 3 days    HPI:      [name] is a 44 y.o. G2P0011 whose LMP was Patient's last menstrual period was 06/01/2019 (exact date)., presents today for increased vag d/c with strong, fishy odor, no irritation for a few days. No meds to treat, no prior abx use. Had vaginal sx at 6/21 f/u appt with Dr. Georgianne Fick and was treated with diflucan twice without sx change. No urin sx, no pelvic pain, fevers.  S/p total lap supracx hyst BS 4/21. She is not sex active.   Past Medical History:  Diagnosis Date  . Blood transfusion without reported diagnosis   . Brain tumor (benign) ([place])   . Seizures ([place])     Past Surgical History:  Procedure Laterality Date  . BRAIN SURGERY  2010   tumor removed  . CESAREAN SECTION    . CYSTOSCOPY  09/10/2019   Procedure: CYSTOSCOPY;  Surgeon: Malachy Mood, MD;  Location: [facility] ORS;  Service: Gynecology;;  . LAPAROSCOPIC SUPRACERVICAL HYSTERECTOMY Bilateral 09/10/2019   Procedure: TOTAL LAPAROSCOPIC HYSTERECTOMY WITH BILATERAL SALPINGECTOMY;  Surgeon: Malachy Mood, MD;  Location: [facility] ORS;  Service: Gynecology;  Laterality: Bilateral;  . MUSCLE BIOPSY     to check for maligmnant hyperthermia  . OTHER SURGICAL HISTORY  11/30/2019   tumor lumbar spine    Family History  Problem Relation Age of Onset  . Hypertension Mother   . COPD Mother     Social History   Socioeconomic History  . Marital status: Legally Separated    Spouse name: Not on file  . Number of children: Not on file  . Years of education: Not on file  . Highest education level: Not on file  Occupational History  . Not on file  Tobacco Use  . Smoking status: Never Smoker  . Smokeless tobacco: Never Used  Vaping Use  . Vaping Use: Never used  Substance and Sexual Activity  . Alcohol use: No  . Drug use: No  . Sexual activity: Not Currently     Birth control/protection: Surgical    Comment: Hysterectomy 2021  Other Topics Concern  . Not on file  Social History Narrative  . Not on file   Social Determinants of Health   Financial Resource Strain:   . Difficulty of Paying Living Expenses:   Food Insecurity:   . Worried About Charity fundraiser in the Last Year:   . Arboriculturist in the Last Year:   Transportation Needs:   . Film/video editor (Medical):   Marland Kitchen Lack of Transportation (Non-Medical):   Physical Activity:   . Days of Exercise per Week:   . Minutes of Exercise per Session:   Stress:   . Feeling of Stress :   Social Connections:   . Frequency of Communication with Friends and Family:   . Frequency of Social Gatherings with Friends and Family:   . Attends Religious Services:   . Active Member of Clubs or Organizations:   . Attends Archivist Meetings:   Marland Kitchen Marital Status:   Intimate Partner Violence:   . Fear of Current or Ex-Partner:   . Emotionally Abused:   Marland Kitchen Physically Abused:   . Sexually Abused:     Outpatient Medications Prior to Visit  Medication Sig Dispense Refill  . carbamazepine (CARBATROL) 300  MG 12 hr capsule Take 600 mg by mouth 2 (two) times daily.    Marland Kitchen levETIRAcetam (KEPPRA) 500 MG tablet Take 750-1,000 mg by mouth See admin instructions. Take 2 tablets (1000 mg) by mouth in the morning and take 1 tablets (750 mg) by mouth at night.    . cyclobenzaprine (FLEXERIL) 10 MG tablet Take 10 mg by mouth 3 (three) times daily.    Marland Kitchen HYDROcodone-acetaminophen (NORCO/VICODIN) 5-325 MG tablet Take 1-2 tablets by mouth every 6 (six) hours as needed for severe pain.     No facility-administered medications prior to visit.      ROS:  Review of Systems  Constitutional: Negative for fever.  Gastrointestinal: Negative for blood in stool, constipation, diarrhea, nausea and vomiting.  Genitourinary: Positive for vaginal discharge. Negative for dyspareunia, dysuria, flank pain,  frequency, hematuria, urgency, vaginal bleeding and vaginal pain.  Musculoskeletal: Negative for back pain.  Skin: Negative for rash.   BREAST: No symptoms   OBJECTIVE:   Vitals:  BP 110/80   Ht 5\' 5"  (1.651 m)   Wt 178 lb (80.7 kg)   LMP 06/01/2019 (Exact Date)   BMI 29.62 kg/m   Physical Exam Vitals reviewed.  Constitutional:      Appearance: She is well-developed.  Pulmonary:     Effort: Pulmonary effort is normal.  Genitourinary:    General: Normal vulva.     Pubic Area: No rash.      Labia:        Right: No rash, tenderness or lesion.        Left: No rash, tenderness or lesion.      Vagina: Normal. No vaginal discharge, erythema or tenderness.     Cervix: Normal.     Adnexa: Right adnexa normal.       Right: No mass or tenderness.         Left: No mass or tenderness.       Comments: UTERUS SURG ABSENT Musculoskeletal:        General: Normal range of motion.     Cervical back: Normal range of motion.  Skin:    General: Skin is warm and dry.  Neurological:     General: No focal deficit present.     Mental Status: She is alert and oriented to person, place, and time.  Psychiatric:        Mood and Affect: Mood normal.        Behavior: Behavior normal.        Thought Content: Thought content normal.        Judgment: Judgment normal.     Results: Results for orders placed or performed in visit on 12/22/19 (from the past 24 hour(s))  POCT Wet Prep with KOH     Status: Normal   Collection Time: 12/22/19  5:20 PM  Result Value Ref Range   Trichomonas, UA Negative    Clue Cells Wet Prep HPF POC neg    Epithelial Wet Prep HPF POC     Yeast Wet Prep HPF POC neg    Bacteria Wet Prep HPF POC     RBC Wet Prep HPF POC     WBC Wet Prep HPF POC     KOH Prep POC Negative Negative     Assessment/Plan: Vaginal discharge - Plan: metroNIDAZOLE (FLAGYL) 500 MG tablet, POCT Wet Prep with KOH; Pos sx, neg wet prep. Treat empirically for BV. Rx flagyl, no EtOH. Will  check culture if sx persist. F/u prn.  Meds ordered this encounter  Medications  . metroNIDAZOLE (FLAGYL) 500 MG tablet    Sig: Take 1 tablet (500 mg total) by mouth 2 (two) times daily for 7 days.    Dispense:  14 tablet    Refill:  0    Order Specific Question:   Supervising Provider    Answer:   Gae Dry [[id number]]      Return if symptoms worsen or fail to improve.  Beaux Wedemeyer B. Shahab Polhamus, PA-C 12/22/2019 5:22 PM

## 2019-12-22 NOTE — Patient Instructions (Signed)
I value your feedback and entrusting us with your care. If you get a [place] patient survey, I would appreciate you taking the time to let us know about your experience today. Thank you!  As of April 30, 2019, your lab results will be released to your MyChart immediately, before I even have a chance to see them. Please give me time to review them and contact you if there are any abnormalities. Thank you for your patience.  

## 2020-01-04 DIAGNOSIS — C719 Malignant neoplasm of brain, unspecified: Secondary | ICD-10-CM | POA: Diagnosis not present

## 2020-01-04 DIAGNOSIS — C7989 Secondary malignant neoplasm of other specified sites: Secondary | ICD-10-CM | POA: Diagnosis not present

## 2020-01-12 DIAGNOSIS — C719 Malignant neoplasm of brain, unspecified: Secondary | ICD-10-CM | POA: Diagnosis not present

## 2020-02-01 DIAGNOSIS — C7949 Secondary malignant neoplasm of other parts of nervous system: Secondary | ICD-10-CM | POA: Diagnosis not present

## 2020-02-01 DIAGNOSIS — C719 Malignant neoplasm of brain, unspecified: Secondary | ICD-10-CM | POA: Diagnosis not present

## 2020-02-01 DIAGNOSIS — D509 Iron deficiency anemia, unspecified: Secondary | ICD-10-CM | POA: Diagnosis not present

## 2020-02-01 DIAGNOSIS — Z780 Asymptomatic menopausal state: Secondary | ICD-10-CM | POA: Diagnosis not present

## 2020-02-01 DIAGNOSIS — D696 Thrombocytopenia, unspecified: Secondary | ICD-10-CM | POA: Diagnosis not present

## 2020-02-08 DIAGNOSIS — C719 Malignant neoplasm of brain, unspecified: Secondary | ICD-10-CM | POA: Diagnosis not present

## 2020-03-07 DIAGNOSIS — C719 Malignant neoplasm of brain, unspecified: Secondary | ICD-10-CM | POA: Diagnosis not present

## 2020-03-14 DIAGNOSIS — C719 Malignant neoplasm of brain, unspecified: Secondary | ICD-10-CM | POA: Diagnosis not present

## 2020-03-14 DIAGNOSIS — R519 Headache, unspecified: Secondary | ICD-10-CM | POA: Diagnosis not present

## 2020-03-14 DIAGNOSIS — C7949 Secondary malignant neoplasm of other parts of nervous system: Secondary | ICD-10-CM | POA: Diagnosis not present

## 2020-03-14 DIAGNOSIS — Z23 Encounter for immunization: Secondary | ICD-10-CM | POA: Diagnosis not present

## 2020-03-14 DIAGNOSIS — Z780 Asymptomatic menopausal state: Secondary | ICD-10-CM | POA: Diagnosis not present

## 2020-03-14 DIAGNOSIS — Z79899 Other long term (current) drug therapy: Secondary | ICD-10-CM | POA: Diagnosis not present

## 2020-03-22 DIAGNOSIS — Z79899 Other long term (current) drug therapy: Secondary | ICD-10-CM | POA: Diagnosis not present

## 2020-03-22 DIAGNOSIS — Z862 Personal history of diseases of the blood and blood-forming organs and certain disorders involving the immune mechanism: Secondary | ICD-10-CM | POA: Diagnosis not present

## 2020-03-22 DIAGNOSIS — C7949 Secondary malignant neoplasm of other parts of nervous system: Secondary | ICD-10-CM | POA: Diagnosis not present

## 2020-03-22 DIAGNOSIS — Z9889 Other specified postprocedural states: Secondary | ICD-10-CM | POA: Diagnosis not present

## 2020-03-22 DIAGNOSIS — R569 Unspecified convulsions: Secondary | ICD-10-CM | POA: Diagnosis not present

## 2020-04-08 DIAGNOSIS — C719 Malignant neoplasm of brain, unspecified: Secondary | ICD-10-CM | POA: Diagnosis not present

## 2020-04-11 DIAGNOSIS — C719 Malignant neoplasm of brain, unspecified: Secondary | ICD-10-CM | POA: Diagnosis not present

## 2020-04-11 DIAGNOSIS — C718 Malignant neoplasm of overlapping sites of brain: Secondary | ICD-10-CM | POA: Diagnosis not present

## 2020-04-11 DIAGNOSIS — Z9889 Other specified postprocedural states: Secondary | ICD-10-CM | POA: Diagnosis not present

## 2020-04-11 DIAGNOSIS — R937 Abnormal findings on diagnostic imaging of other parts of musculoskeletal system: Secondary | ICD-10-CM | POA: Diagnosis not present

## 2020-04-18 DIAGNOSIS — Z79899 Other long term (current) drug therapy: Secondary | ICD-10-CM | POA: Diagnosis not present

## 2020-04-18 DIAGNOSIS — R569 Unspecified convulsions: Secondary | ICD-10-CM | POA: Diagnosis not present

## 2020-04-18 DIAGNOSIS — Z780 Asymptomatic menopausal state: Secondary | ICD-10-CM | POA: Diagnosis not present

## 2020-04-18 DIAGNOSIS — C719 Malignant neoplasm of brain, unspecified: Secondary | ICD-10-CM | POA: Diagnosis not present

## 2020-04-18 DIAGNOSIS — M5382 Other specified dorsopathies, cervical region: Secondary | ICD-10-CM | POA: Diagnosis not present

## 2020-04-18 DIAGNOSIS — Z9221 Personal history of antineoplastic chemotherapy: Secondary | ICD-10-CM | POA: Diagnosis not present

## 2020-04-18 DIAGNOSIS — M542 Cervicalgia: Secondary | ICD-10-CM | POA: Diagnosis not present

## 2020-04-18 DIAGNOSIS — R519 Headache, unspecified: Secondary | ICD-10-CM | POA: Diagnosis not present

## 2020-04-25 DIAGNOSIS — R569 Unspecified convulsions: Secondary | ICD-10-CM | POA: Diagnosis not present

## 2020-04-25 DIAGNOSIS — Z780 Asymptomatic menopausal state: Secondary | ICD-10-CM | POA: Diagnosis not present

## 2020-04-25 DIAGNOSIS — C719 Malignant neoplasm of brain, unspecified: Secondary | ICD-10-CM | POA: Diagnosis not present

## 2020-04-25 DIAGNOSIS — R51 Headache with orthostatic component, not elsewhere classified: Secondary | ICD-10-CM | POA: Diagnosis not present

## 2020-04-25 DIAGNOSIS — Z79899 Other long term (current) drug therapy: Secondary | ICD-10-CM | POA: Diagnosis not present

## 2020-04-25 DIAGNOSIS — Z9221 Personal history of antineoplastic chemotherapy: Secondary | ICD-10-CM | POA: Diagnosis not present

## 2020-05-09 DIAGNOSIS — C719 Malignant neoplasm of brain, unspecified: Secondary | ICD-10-CM | POA: Diagnosis not present

## 2020-05-23 DIAGNOSIS — C712 Malignant neoplasm of temporal lobe: Secondary | ICD-10-CM | POA: Diagnosis not present

## 2020-05-23 DIAGNOSIS — R200 Anesthesia of skin: Secondary | ICD-10-CM | POA: Diagnosis not present

## 2020-05-23 DIAGNOSIS — Z79899 Other long term (current) drug therapy: Secondary | ICD-10-CM | POA: Diagnosis not present

## 2020-05-23 DIAGNOSIS — R202 Paresthesia of skin: Secondary | ICD-10-CM | POA: Diagnosis not present

## 2020-05-23 DIAGNOSIS — R519 Headache, unspecified: Secondary | ICD-10-CM | POA: Diagnosis not present

## 2020-05-31 DIAGNOSIS — Z9889 Other specified postprocedural states: Secondary | ICD-10-CM | POA: Diagnosis not present

## 2020-05-31 DIAGNOSIS — M542 Cervicalgia: Secondary | ICD-10-CM | POA: Diagnosis not present

## 2020-05-31 DIAGNOSIS — R262 Difficulty in walking, not elsewhere classified: Secondary | ICD-10-CM | POA: Diagnosis not present

## 2020-05-31 DIAGNOSIS — R519 Headache, unspecified: Secondary | ICD-10-CM | POA: Diagnosis not present

## 2020-05-31 DIAGNOSIS — R202 Paresthesia of skin: Secondary | ICD-10-CM | POA: Diagnosis not present

## 2020-05-31 DIAGNOSIS — R42 Dizziness and giddiness: Secondary | ICD-10-CM | POA: Diagnosis not present

## 2020-05-31 DIAGNOSIS — C719 Malignant neoplasm of brain, unspecified: Secondary | ICD-10-CM | POA: Diagnosis not present

## 2020-06-03 ENCOUNTER — Other Ambulatory Visit: Payer: Self-pay

## 2020-06-03 ENCOUNTER — Emergency Department: Payer: Medicare Other

## 2020-06-03 ENCOUNTER — Emergency Department
Admission: EM | Admit: 2020-06-03 | Discharge: 2020-06-04 | Disposition: A | Discharge status: Home / Self Care | Payer: Medicare Other | Attending: Student in an Organized Health Care Education/Training Program | Admitting: Student in an Organized Health Care Education/Training Program

## 2020-06-03 DIAGNOSIS — G930 Cerebral cysts: Secondary | ICD-10-CM | POA: Diagnosis not present

## 2020-06-03 DIAGNOSIS — D496 Neoplasm of unspecified behavior of brain: Secondary | ICD-10-CM | POA: Diagnosis not present

## 2020-06-03 DIAGNOSIS — G9389 Other specified disorders of brain: Secondary | ICD-10-CM | POA: Diagnosis not present

## 2020-06-03 DIAGNOSIS — R030 Elevated blood-pressure reading, without diagnosis of hypertension: Secondary | ICD-10-CM | POA: Insufficient documentation

## 2020-06-03 DIAGNOSIS — R519 Headache, unspecified: Secondary | ICD-10-CM | POA: Diagnosis not present

## 2020-06-03 DIAGNOSIS — H539 Unspecified visual disturbance: Secondary | ICD-10-CM

## 2020-06-03 DIAGNOSIS — M2578 Osteophyte, vertebrae: Secondary | ICD-10-CM | POA: Diagnosis not present

## 2020-06-03 DIAGNOSIS — R220 Localized swelling, mass and lump, head: Secondary | ICD-10-CM | POA: Diagnosis not present

## 2020-06-03 DIAGNOSIS — M4802 Spinal stenosis, cervical region: Secondary | ICD-10-CM | POA: Diagnosis not present

## 2020-06-03 LAB — CBC
HCT: 39.6 % (ref 36.0–46.0)
Hemoglobin: 12.8 g/dL (ref 12.0–15.0)
MCH: 27.6 pg (ref 26.0–34.0)
MCHC: 32.3 g/dL (ref 30.0–36.0)
MCV: 85.3 fL (ref 80.0–100.0)
Platelets: 312 10*3/uL (ref 150–400)
RBC: 4.64 MIL/uL (ref 3.87–5.11)
RDW: 14.7 % (ref 11.5–15.5)
WBC: 10.7 10*3/uL — ABNORMAL HIGH (ref 4.0–10.5)
nRBC: 0 % (ref 0.0–0.2)

## 2020-06-03 LAB — COMPREHENSIVE METABOLIC PANEL
ALT: 15 U/L (ref 0–44)
AST: 19 U/L (ref 15–41)
Albumin: 4.6 g/dL (ref 3.5–5.0)
Alkaline Phosphatase: 88 U/L (ref 38–126)
Anion gap: 11 (ref 5–15)
BUN: 14 mg/dL (ref 6–20)
CO2: 27 mmol/L (ref 22–32)
Calcium: 9.7 mg/dL (ref 8.9–10.3)
Chloride: 102 mmol/L (ref 98–111)
Creatinine, Ser: 0.71 mg/dL (ref 0.44–1.00)
GFR, Estimated: >60 mL/min (ref >60)
Glucose, Bld: 110 mg/dL — ABNORMAL HIGH (ref 70–99)
Potassium: 3.9 mmol/L (ref 3.5–5.1)
Sodium: 140 mmol/L (ref 135–145)
Total Bilirubin: 0.7 mg/dL (ref 0.3–1.2)
Total Protein: 8.7 g/dL — ABNORMAL HIGH (ref 6.5–8.1)

## 2020-06-03 MED ORDER — HYDROMORPHONE HCL 1 MG/ML IJ SOLN
1.0000 mg | INTRAMUSCULAR | Status: AC
  Administered 2020-06-03: 1 mg via INTRAVENOUS
  Filled 2020-06-03: qty 1

## 2020-06-03 MED ORDER — GADOBUTROL 1 MMOL/ML IV SOLN
7.5000 mL | Freq: Once | INTRAVENOUS | Status: AC | PRN
  Administered 2020-06-03: 7.5 mL via INTRAVENOUS

## 2020-06-03 NOTE — Discharge Instructions (Addendum)
Please follow up with Dr. Inocente Salles.  If you develop any worsening headache, numbness, tingling, visual disturbance, please return for evaluation.

## 2020-06-03 NOTE — ED Triage Notes (Addendum)
Reports hx of tumors in spine and brain; recurrent headaches. Reports that yesterday approx 1230PM starting with severe headache, neck pain and "white vision" that last for "some time until I feel asleep".  Reports continued headache and neck pain today, and "heaviness in eyes".    Missed her scheduled MRI head and neck today, pt's neurologist has not been notified of new sx.

## 2020-06-03 NOTE — ED Notes (Signed)
Offered tylenol, declines. Pt drove herself here.

## 2020-06-03 NOTE — ED Notes (Signed)
Dr. Quentin Cornwall discussing plan of care with pt

## 2020-06-03 NOTE — ED Notes (Signed)
Pt in family room, Dr. Quentin Cornwall at bedside

## 2020-06-03 NOTE — ED Provider Notes (Signed)
[facility] Emergency Department Provider Note    Event Date/Time   First MD Initiated Contact with Patient 06/03/20 2318     (approximate)  I have reviewed the triage vital signs and the nursing notes.   HISTORY  Chief Complaint Headache    HPI Sheila Moore is a 45 y.o. 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 Headache was different than previous headaches and that it lasted several hours. During the headache she had throbbing discomfort in the entire part of her head as well as she describes as "white out"  of her entire vision of her right eye as well as right visual field in her left eye and then had blurry vision to the left peripheral vision of her left eye. This lasted roughly 5 to 10 minutes. States the headache is currently resolved. Denies any numbness or tingling.   Past Medical History:  Diagnosis Date  . Blood transfusion without reported diagnosis   . Brain tumor (benign) ([place])   . Seizures ([place])    Family History  Problem Relation Age of Onset  . Hypertension Mother   . COPD Mother    Past Surgical History:  Procedure Laterality Date  . BRAIN SURGERY  2010   tumor removed  . CESAREAN SECTION    . CYSTOSCOPY  09/10/2019   Procedure: CYSTOSCOPY;  Surgeon: Malachy Mood, MD;  Location: [facility] ORS;  Service: Gynecology;;  . LAPAROSCOPIC SUPRACERVICAL HYSTERECTOMY Bilateral 09/10/2019   Procedure: TOTAL LAPAROSCOPIC HYSTERECTOMY WITH BILATERAL SALPINGECTOMY;  Surgeon: Malachy Mood, MD;  Location: [facility] ORS;  Service: Gynecology;  Laterality: Bilateral;  . MUSCLE BIOPSY     to check for maligmnant hyperthermia  . OTHER SURGICAL HISTORY  11/30/2019   tumor lumbar spine   Patient Active Problem List   Diagnosis Date Noted  . S/P hysterectomy 09/10/2019   . Acute blood loss anemia 09/08/2019  . Menorrhagia 05/23/2016  . Anemia 05/23/2016  . Nipple discharge 11/06/2012      Prior to Admission medications   Medication Sig Start Date End Date Taking? Authorizing Provider  carbamazepine (CARBATROL) 300 MG 12 hr capsule Take 600 mg by mouth 2 (two) times daily. 09/18/18   [provider]  levETIRAcetam (KEPPRA) 500 MG tablet Take 750-1,000 mg by mouth See admin instructions. Take 2 tablets (1000 mg) by mouth in the morning and take 1 tablets (750 mg) by mouth at night. 09/18/18   [provider]    Allergies Patient has no known allergies.    Social History Social History   Tobacco Use  . Smoking status: Never Smoker  . Smokeless tobacco: Never Used  Vaping Use  . Vaping Use: Never used  Substance Use Topics  . Alcohol use: No  . Drug use: No    Review of Systems Patient denies headaches, rhinorrhea, blurry vision, numbness, shortness of breath, chest pain, edema, cough, abdominal pain, nausea, vomiting, diarrhea, dysuria, fevers, rashes or hallucinations unless otherwise stated above in HPI. ____________________________________________   PHYSICAL EXAM:  VITAL SIGNS: Vitals:   06/03/20 1704 06/03/20 2040  BP: (!) 134/97 (!) 152/91  Pulse: 89 60  Resp: 16 16  Temp:  98.2 F (36.8 C)  SpO2: 100% (!) 10%    Constitutional: Alert and oriented.  Eyes: Conjunctivae are normal.  Head: Atraumatic. Nose: No congestion/rhinnorhea. Mouth/Throat: Mucous membranes  are moist.   Neck: No stridor. Painless ROM.  Cardiovascular: Normal rate, regular rhythm. Grossly normal heart sounds.  Good peripheral circulation. Respiratory: Normal respiratory effort.  No retractions. Lungs CTAB. Gastrointestinal: Soft and nontender. No distention. No abdominal bruits. No CVA tenderness. Genitourinary:  Musculoskeletal: No lower extremity tenderness nor edema.  No joint effusions. Neurologic:  Normal speech and language. No  gross focal neurologic deficits are appreciated. No facial droop Skin:  Skin is warm, dry and intact. No rash noted. Psychiatric: Mood and affect are normal. Speech and behavior are normal.  ____________________________________________   LABS (all labs ordered are listed, but only abnormal results are displayed)  Results for orders placed or performed during the hospital encounter of 06/03/20 (from the past 24 hour(s))  CBC     Status: Abnormal   Collection Time: 06/03/20  2:29 PM  Result Value Ref Range   WBC 10.7 (H) 4.0 - 10.5 K/uL   RBC 4.64 3.87 - 5.11 MIL/uL   Hemoglobin 12.8 12.0 - 15.0 g/dL   HCT 39.6 36.0 - 46.0 %   MCV 85.3 80.0 - 100.0 fL   MCH 27.6 26.0 - 34.0 pg   MCHC 32.3 30.0 - 36.0 g/dL   RDW 14.7 11.5 - 15.5 %   Platelets 312 150 - 400 K/uL   nRBC 0.0 0.0 - 0.2 %  Comprehensive metabolic panel     Status: Abnormal   Collection Time: 06/03/20  2:29 PM  Result Value Ref Range   Sodium 140 135 - 145 mmol/L   Potassium 3.9 3.5 - 5.1 mmol/L   Chloride 102 98 - 111 mmol/L   CO2 27 22 - 32 mmol/L   Glucose, Bld 110 (H) 70 - 99 mg/dL   BUN 14 6 - 20 mg/dL   Creatinine, Ser 0.71 0.44 - 1.00 mg/dL   Calcium 9.7 8.9 - 10.3 mg/dL   Total Protein 8.7 (H) 6.5 - 8.1 g/dL   Albumin 4.6 3.5 - 5.0 g/dL   AST 19 15 - 41 U/L   ALT 15 0 - 44 U/L   Alkaline Phosphatase 88 38 - 126 U/L   Total Bilirubin 0.7 0.3 - 1.2 mg/dL   GFR, Estimated >60 >60 mL/min   Anion gap 11 5 - 15   ____________________________________________ ____________________________________________  RADIOLOGY  I personally reviewed all radiographic images ordered to evaluate for the above acute complaints and reviewed radiology reports and findings.  These findings were personally discussed with the patient.  Please see medical record for radiology report.  ____________________________________________   PROCEDURES  Procedure(s) performed:  Procedures    Critical Care performed:  no ____________________________________________   INITIAL IMPRESSION / ASSESSMENT AND PLAN / ED COURSE  Pertinent labs & imaging results that were available during my care of the patient were reviewed by me and considered in my medical decision making (see chart for details).   DDX: mass, sah, iph, cva, seizure, migraine, tension  Sheila Moore is a 45 y.o. who presents to the ED with extensive and complex past medical history as described above presents to the ER with symptoms as described above. She is currently neuro intact nontoxic-appearing afebrile hemodynamically stable. MRI ordered for the blood differential shows evidence of interval enlargement of necrotic mass. Will consult with [facility] for further recommendations  Clinical Course as of 06/03/20 2359  Fri Jun 03, 2020  2357 Case discussed in consultation with Dr. Durene Cal of neurology at [facility].  Patient already on substantial dose of Decadron.  She is currently well-appearing with no new neurodeficits at this time.  Imaging is concerning of progression of her disease but as [facility] is currently at capacity as well transfer is not currently an option.  She is otherwise well-appearing feels back to her baseline I think that close outpatient follow-up is reasonable in this scenario.  We discussed strict return precautions and signs and symptoms for which she should return to the ER.  Patient is agreeable to plan.  [PR]    Clinical Course User Index [PR] Merlyn Lot, MD    The patient was evaluated in Emergency Department today for the symptoms described in the history of present illness. He/she was evaluated in the context of the global COVID-19 pandemic, which necessitated consideration that the patient might be at risk for infection with the SARS-CoV-2 virus that causes COVID-19. Institutional protocols and algorithms that pertain to the evaluation of patients at risk for COVID-19 are in a state of rapid change based  on information released by regulatory bodies including the CDC and federal and state organizations. These policies and algorithms were followed during the patient's care in the ED.  As part of my medical decision making, I reviewed the following data within the [place] notes reviewed and incorporated, Labs reviewed, notes from prior ED visits and [place] Controlled Substance Database   ____________________________________________   FINAL CLINICAL IMPRESSION(S) / ED DIAGNOSES  Final diagnoses:  Nonintractable headache, unspecified chronicity pattern, unspecified headache type  Visual disturbance      NEW MEDICATIONS STARTED DURING THIS VISIT:  New Prescriptions   No medications on file     Note:  This document was prepared using Dragon voice recognition software and may include unintentional dictation errors.    Merlyn Lot, MD 06/03/20 [phone]

## 2020-06-04 NOTE — ED Notes (Signed)
Signature pad did not work in triage 2 pt's signed paper

## 2020-06-07 DIAGNOSIS — Z20822 Contact with and (suspected) exposure to covid-19: Secondary | ICD-10-CM | POA: Diagnosis not present

## 2020-06-07 DIAGNOSIS — C719 Malignant neoplasm of brain, unspecified: Secondary | ICD-10-CM | POA: Diagnosis not present

## 2020-06-07 DIAGNOSIS — M542 Cervicalgia: Secondary | ICD-10-CM | POA: Diagnosis not present

## 2020-06-07 DIAGNOSIS — G9389 Other specified disorders of brain: Secondary | ICD-10-CM | POA: Diagnosis not present

## 2020-06-07 DIAGNOSIS — I629 Nontraumatic intracranial hemorrhage, unspecified: Secondary | ICD-10-CM | POA: Diagnosis not present

## 2020-06-07 DIAGNOSIS — I618 Other nontraumatic intracerebral hemorrhage: Secondary | ICD-10-CM | POA: Diagnosis not present

## 2020-06-07 DIAGNOSIS — Z79899 Other long term (current) drug therapy: Secondary | ICD-10-CM | POA: Diagnosis not present

## 2020-06-07 DIAGNOSIS — Z7952 Long term (current) use of systemic steroids: Secondary | ICD-10-CM | POA: Diagnosis not present

## 2020-06-07 DIAGNOSIS — R519 Headache, unspecified: Secondary | ICD-10-CM | POA: Diagnosis not present

## 2020-06-08 DIAGNOSIS — M542 Cervicalgia: Secondary | ICD-10-CM | POA: Diagnosis not present

## 2020-06-08 DIAGNOSIS — C719 Malignant neoplasm of brain, unspecified: Secondary | ICD-10-CM | POA: Diagnosis not present

## 2020-06-08 DIAGNOSIS — R519 Headache, unspecified: Secondary | ICD-10-CM | POA: Diagnosis not present

## 2020-06-08 DIAGNOSIS — C712 Malignant neoplasm of temporal lobe: Secondary | ICD-10-CM | POA: Diagnosis not present

## 2020-06-08 DIAGNOSIS — I618 Other nontraumatic intracerebral hemorrhage: Secondary | ICD-10-CM | POA: Diagnosis not present

## 2020-06-09 DIAGNOSIS — C712 Malignant neoplasm of temporal lobe: Secondary | ICD-10-CM | POA: Diagnosis not present

## 2020-06-09 DIAGNOSIS — C7949 Secondary malignant neoplasm of other parts of nervous system: Secondary | ICD-10-CM | POA: Diagnosis not present

## 2020-06-09 DIAGNOSIS — R519 Headache, unspecified: Secondary | ICD-10-CM | POA: Diagnosis not present

## 2020-06-09 DIAGNOSIS — I618 Other nontraumatic intracerebral hemorrhage: Secondary | ICD-10-CM | POA: Diagnosis not present

## 2020-06-09 DIAGNOSIS — C719 Malignant neoplasm of brain, unspecified: Secondary | ICD-10-CM | POA: Diagnosis not present

## 2020-06-16 DIAGNOSIS — G919 Hydrocephalus, unspecified: Secondary | ICD-10-CM | POA: Diagnosis not present

## 2020-06-16 DIAGNOSIS — Z8249 Family history of ischemic heart disease and other diseases of the circulatory system: Secondary | ICD-10-CM | POA: Diagnosis not present

## 2020-06-16 DIAGNOSIS — G43909 Migraine, unspecified, not intractable, without status migrainosus: Secondary | ICD-10-CM | POA: Diagnosis not present

## 2020-06-16 DIAGNOSIS — Z833 Family history of diabetes mellitus: Secondary | ICD-10-CM | POA: Diagnosis not present

## 2020-06-16 DIAGNOSIS — Z823 Family history of stroke: Secondary | ICD-10-CM | POA: Diagnosis not present

## 2020-06-16 DIAGNOSIS — C719 Malignant neoplasm of brain, unspecified: Secondary | ICD-10-CM | POA: Diagnosis not present

## 2020-06-16 DIAGNOSIS — Z79899 Other long term (current) drug therapy: Secondary | ICD-10-CM | POA: Diagnosis not present

## 2020-06-16 DIAGNOSIS — R569 Unspecified convulsions: Secondary | ICD-10-CM | POA: Diagnosis not present

## 2020-06-20 DIAGNOSIS — R569 Unspecified convulsions: Secondary | ICD-10-CM | POA: Diagnosis not present

## 2020-06-20 DIAGNOSIS — G914 Hydrocephalus in diseases classified elsewhere: Secondary | ICD-10-CM | POA: Diagnosis not present

## 2020-06-20 DIAGNOSIS — G43909 Migraine, unspecified, not intractable, without status migrainosus: Secondary | ICD-10-CM | POA: Diagnosis not present

## 2020-06-20 DIAGNOSIS — Z823 Family history of stroke: Secondary | ICD-10-CM | POA: Diagnosis not present

## 2020-06-20 DIAGNOSIS — Z4541 Encounter for adjustment and management of cerebrospinal fluid drainage device: Secondary | ICD-10-CM | POA: Diagnosis not present

## 2020-06-20 DIAGNOSIS — G919 Hydrocephalus, unspecified: Secondary | ICD-10-CM | POA: Diagnosis not present

## 2020-06-20 DIAGNOSIS — D496 Neoplasm of unspecified behavior of brain: Secondary | ICD-10-CM | POA: Diagnosis not present

## 2020-06-20 DIAGNOSIS — C719 Malignant neoplasm of brain, unspecified: Secondary | ICD-10-CM | POA: Diagnosis not present

## 2020-06-20 DIAGNOSIS — Z79899 Other long term (current) drug therapy: Secondary | ICD-10-CM | POA: Diagnosis not present

## 2020-06-20 DIAGNOSIS — Z833 Family history of diabetes mellitus: Secondary | ICD-10-CM | POA: Diagnosis not present

## 2020-06-20 DIAGNOSIS — Z8249 Family history of ischemic heart disease and other diseases of the circulatory system: Secondary | ICD-10-CM | POA: Diagnosis not present

## 2020-06-20 DIAGNOSIS — Z982 Presence of cerebrospinal fluid drainage device: Secondary | ICD-10-CM | POA: Diagnosis not present

## 2020-06-24 DIAGNOSIS — C712 Malignant neoplasm of temporal lobe: Secondary | ICD-10-CM | POA: Diagnosis not present

## 2020-06-24 DIAGNOSIS — C719 Malignant neoplasm of brain, unspecified: Secondary | ICD-10-CM | POA: Diagnosis not present

## 2020-06-27 DIAGNOSIS — C712 Malignant neoplasm of temporal lobe: Secondary | ICD-10-CM | POA: Diagnosis not present

## 2020-06-27 DIAGNOSIS — C719 Malignant neoplasm of brain, unspecified: Secondary | ICD-10-CM | POA: Diagnosis not present

## 2020-06-27 DIAGNOSIS — Z79899 Other long term (current) drug therapy: Secondary | ICD-10-CM | POA: Diagnosis not present

## 2020-07-05 DIAGNOSIS — Z510 Encounter for antineoplastic radiation therapy: Secondary | ICD-10-CM | POA: Diagnosis not present

## 2020-07-05 DIAGNOSIS — C7951 Secondary malignant neoplasm of bone: Secondary | ICD-10-CM | POA: Diagnosis not present

## 2020-07-05 DIAGNOSIS — R519 Headache, unspecified: Secondary | ICD-10-CM | POA: Diagnosis not present

## 2020-07-05 DIAGNOSIS — C719 Malignant neoplasm of brain, unspecified: Secondary | ICD-10-CM | POA: Diagnosis not present

## 2020-07-05 DIAGNOSIS — Z9221 Personal history of antineoplastic chemotherapy: Secondary | ICD-10-CM | POA: Diagnosis not present

## 2020-07-05 DIAGNOSIS — G35 Multiple sclerosis: Secondary | ICD-10-CM | POA: Diagnosis not present

## 2020-07-13 DIAGNOSIS — C7951 Secondary malignant neoplasm of bone: Secondary | ICD-10-CM | POA: Diagnosis not present

## 2020-07-13 DIAGNOSIS — Z510 Encounter for antineoplastic radiation therapy: Secondary | ICD-10-CM | POA: Diagnosis not present

## 2020-07-13 DIAGNOSIS — Z9221 Personal history of antineoplastic chemotherapy: Secondary | ICD-10-CM | POA: Diagnosis not present

## 2020-07-13 DIAGNOSIS — C719 Malignant neoplasm of brain, unspecified: Secondary | ICD-10-CM | POA: Diagnosis not present

## 2020-07-14 DIAGNOSIS — C719 Malignant neoplasm of brain, unspecified: Secondary | ICD-10-CM | POA: Diagnosis not present

## 2020-07-14 DIAGNOSIS — Z9221 Personal history of antineoplastic chemotherapy: Secondary | ICD-10-CM | POA: Diagnosis not present

## 2020-07-14 DIAGNOSIS — C7951 Secondary malignant neoplasm of bone: Secondary | ICD-10-CM | POA: Diagnosis not present

## 2020-07-14 DIAGNOSIS — Z510 Encounter for antineoplastic radiation therapy: Secondary | ICD-10-CM | POA: Diagnosis not present

## 2020-07-18 DIAGNOSIS — C7951 Secondary malignant neoplasm of bone: Secondary | ICD-10-CM | POA: Diagnosis not present

## 2020-07-18 DIAGNOSIS — Z9221 Personal history of antineoplastic chemotherapy: Secondary | ICD-10-CM | POA: Diagnosis not present

## 2020-07-18 DIAGNOSIS — Z510 Encounter for antineoplastic radiation therapy: Secondary | ICD-10-CM | POA: Diagnosis not present

## 2020-07-18 DIAGNOSIS — C719 Malignant neoplasm of brain, unspecified: Secondary | ICD-10-CM | POA: Diagnosis not present

## 2020-07-19 DIAGNOSIS — C7951 Secondary malignant neoplasm of bone: Secondary | ICD-10-CM | POA: Diagnosis not present

## 2020-07-19 DIAGNOSIS — Z510 Encounter for antineoplastic radiation therapy: Secondary | ICD-10-CM | POA: Diagnosis not present

## 2020-07-19 DIAGNOSIS — C719 Malignant neoplasm of brain, unspecified: Secondary | ICD-10-CM | POA: Diagnosis not present

## 2020-07-19 DIAGNOSIS — R030 Elevated blood-pressure reading, without diagnosis of hypertension: Secondary | ICD-10-CM | POA: Diagnosis not present

## 2020-07-19 DIAGNOSIS — K047 Periapical abscess without sinus: Secondary | ICD-10-CM | POA: Diagnosis not present

## 2020-07-20 DIAGNOSIS — C719 Malignant neoplasm of brain, unspecified: Secondary | ICD-10-CM | POA: Diagnosis not present

## 2020-07-20 DIAGNOSIS — C7951 Secondary malignant neoplasm of bone: Secondary | ICD-10-CM | POA: Diagnosis not present

## 2020-07-20 DIAGNOSIS — Z510 Encounter for antineoplastic radiation therapy: Secondary | ICD-10-CM | POA: Diagnosis not present

## 2020-07-21 DIAGNOSIS — Z510 Encounter for antineoplastic radiation therapy: Secondary | ICD-10-CM | POA: Diagnosis not present

## 2020-07-21 DIAGNOSIS — C719 Malignant neoplasm of brain, unspecified: Secondary | ICD-10-CM | POA: Diagnosis not present

## 2020-07-21 DIAGNOSIS — C7951 Secondary malignant neoplasm of bone: Secondary | ICD-10-CM | POA: Diagnosis not present

## 2020-07-22 DIAGNOSIS — Z510 Encounter for antineoplastic radiation therapy: Secondary | ICD-10-CM | POA: Diagnosis not present

## 2020-07-22 DIAGNOSIS — C719 Malignant neoplasm of brain, unspecified: Secondary | ICD-10-CM | POA: Diagnosis not present

## 2020-07-22 DIAGNOSIS — C7951 Secondary malignant neoplasm of bone: Secondary | ICD-10-CM | POA: Diagnosis not present

## 2020-07-25 DIAGNOSIS — Z510 Encounter for antineoplastic radiation therapy: Secondary | ICD-10-CM | POA: Diagnosis not present

## 2020-07-25 DIAGNOSIS — C7951 Secondary malignant neoplasm of bone: Secondary | ICD-10-CM | POA: Diagnosis not present

## 2020-07-25 DIAGNOSIS — C719 Malignant neoplasm of brain, unspecified: Secondary | ICD-10-CM | POA: Diagnosis not present

## 2020-07-26 DIAGNOSIS — Z510 Encounter for antineoplastic radiation therapy: Secondary | ICD-10-CM | POA: Diagnosis not present

## 2020-07-26 DIAGNOSIS — C719 Malignant neoplasm of brain, unspecified: Secondary | ICD-10-CM | POA: Diagnosis not present

## 2020-07-26 DIAGNOSIS — C7951 Secondary malignant neoplasm of bone: Secondary | ICD-10-CM | POA: Diagnosis not present

## 2020-07-27 DIAGNOSIS — Z510 Encounter for antineoplastic radiation therapy: Secondary | ICD-10-CM | POA: Diagnosis not present

## 2020-07-27 DIAGNOSIS — C719 Malignant neoplasm of brain, unspecified: Secondary | ICD-10-CM | POA: Diagnosis not present

## 2020-07-27 DIAGNOSIS — C7951 Secondary malignant neoplasm of bone: Secondary | ICD-10-CM | POA: Diagnosis not present

## 2020-07-28 DIAGNOSIS — C719 Malignant neoplasm of brain, unspecified: Secondary | ICD-10-CM | POA: Diagnosis not present

## 2020-07-28 DIAGNOSIS — Z510 Encounter for antineoplastic radiation therapy: Secondary | ICD-10-CM | POA: Diagnosis not present

## 2020-07-28 DIAGNOSIS — C7951 Secondary malignant neoplasm of bone: Secondary | ICD-10-CM | POA: Diagnosis not present

## 2020-07-29 DIAGNOSIS — C719 Malignant neoplasm of brain, unspecified: Secondary | ICD-10-CM | POA: Diagnosis not present

## 2020-07-29 DIAGNOSIS — C7951 Secondary malignant neoplasm of bone: Secondary | ICD-10-CM | POA: Diagnosis not present

## 2020-07-29 DIAGNOSIS — Z510 Encounter for antineoplastic radiation therapy: Secondary | ICD-10-CM | POA: Diagnosis not present

## 2020-08-01 DIAGNOSIS — Z483 Aftercare following surgery for neoplasm: Secondary | ICD-10-CM | POA: Diagnosis not present

## 2020-08-01 DIAGNOSIS — Z982 Presence of cerebrospinal fluid drainage device: Secondary | ICD-10-CM | POA: Diagnosis not present

## 2020-08-01 DIAGNOSIS — Z510 Encounter for antineoplastic radiation therapy: Secondary | ICD-10-CM | POA: Diagnosis not present

## 2020-08-01 DIAGNOSIS — C7951 Secondary malignant neoplasm of bone: Secondary | ICD-10-CM | POA: Diagnosis not present

## 2020-08-01 DIAGNOSIS — C719 Malignant neoplasm of brain, unspecified: Secondary | ICD-10-CM | POA: Diagnosis not present

## 2020-08-02 DIAGNOSIS — C7951 Secondary malignant neoplasm of bone: Secondary | ICD-10-CM | POA: Diagnosis not present

## 2020-08-02 DIAGNOSIS — Z510 Encounter for antineoplastic radiation therapy: Secondary | ICD-10-CM | POA: Diagnosis not present

## 2020-08-02 DIAGNOSIS — C719 Malignant neoplasm of brain, unspecified: Secondary | ICD-10-CM | POA: Diagnosis not present

## 2020-08-03 DIAGNOSIS — C719 Malignant neoplasm of brain, unspecified: Secondary | ICD-10-CM | POA: Diagnosis not present

## 2020-08-03 DIAGNOSIS — C7951 Secondary malignant neoplasm of bone: Secondary | ICD-10-CM | POA: Diagnosis not present

## 2020-08-03 DIAGNOSIS — Z510 Encounter for antineoplastic radiation therapy: Secondary | ICD-10-CM | POA: Diagnosis not present

## 2020-08-04 DIAGNOSIS — C7951 Secondary malignant neoplasm of bone: Secondary | ICD-10-CM | POA: Diagnosis not present

## 2020-08-04 DIAGNOSIS — C719 Malignant neoplasm of brain, unspecified: Secondary | ICD-10-CM | POA: Diagnosis not present

## 2020-08-04 DIAGNOSIS — Z510 Encounter for antineoplastic radiation therapy: Secondary | ICD-10-CM | POA: Diagnosis not present

## 2020-08-05 DIAGNOSIS — C719 Malignant neoplasm of brain, unspecified: Secondary | ICD-10-CM | POA: Diagnosis not present

## 2020-08-05 DIAGNOSIS — Z510 Encounter for antineoplastic radiation therapy: Secondary | ICD-10-CM | POA: Diagnosis not present

## 2020-08-08 DIAGNOSIS — Z510 Encounter for antineoplastic radiation therapy: Secondary | ICD-10-CM | POA: Diagnosis not present

## 2020-08-08 DIAGNOSIS — C719 Malignant neoplasm of brain, unspecified: Secondary | ICD-10-CM | POA: Diagnosis not present

## 2020-08-09 DIAGNOSIS — C719 Malignant neoplasm of brain, unspecified: Secondary | ICD-10-CM | POA: Diagnosis not present

## 2020-08-09 DIAGNOSIS — Z510 Encounter for antineoplastic radiation therapy: Secondary | ICD-10-CM | POA: Diagnosis not present

## 2020-08-10 DIAGNOSIS — C719 Malignant neoplasm of brain, unspecified: Secondary | ICD-10-CM | POA: Diagnosis not present

## 2020-08-10 DIAGNOSIS — Z510 Encounter for antineoplastic radiation therapy: Secondary | ICD-10-CM | POA: Diagnosis not present

## 2020-08-11 DIAGNOSIS — C719 Malignant neoplasm of brain, unspecified: Secondary | ICD-10-CM | POA: Diagnosis not present

## 2020-08-11 DIAGNOSIS — Z510 Encounter for antineoplastic radiation therapy: Secondary | ICD-10-CM | POA: Diagnosis not present

## 2020-08-12 DIAGNOSIS — Z510 Encounter for antineoplastic radiation therapy: Secondary | ICD-10-CM | POA: Diagnosis not present

## 2020-08-12 DIAGNOSIS — C7951 Secondary malignant neoplasm of bone: Secondary | ICD-10-CM | POA: Diagnosis not present

## 2020-08-12 DIAGNOSIS — C719 Malignant neoplasm of brain, unspecified: Secondary | ICD-10-CM | POA: Diagnosis not present

## 2020-08-15 DIAGNOSIS — C7951 Secondary malignant neoplasm of bone: Secondary | ICD-10-CM | POA: Diagnosis not present

## 2020-08-15 DIAGNOSIS — C719 Malignant neoplasm of brain, unspecified: Secondary | ICD-10-CM | POA: Diagnosis not present

## 2020-08-15 DIAGNOSIS — Z510 Encounter for antineoplastic radiation therapy: Secondary | ICD-10-CM | POA: Diagnosis not present

## 2020-08-16 DIAGNOSIS — Z510 Encounter for antineoplastic radiation therapy: Secondary | ICD-10-CM | POA: Diagnosis not present

## 2020-08-16 DIAGNOSIS — C7951 Secondary malignant neoplasm of bone: Secondary | ICD-10-CM | POA: Diagnosis not present

## 2020-08-16 DIAGNOSIS — C719 Malignant neoplasm of brain, unspecified: Secondary | ICD-10-CM | POA: Diagnosis not present

## 2020-08-17 DIAGNOSIS — C7951 Secondary malignant neoplasm of bone: Secondary | ICD-10-CM | POA: Diagnosis not present

## 2020-08-17 DIAGNOSIS — Z510 Encounter for antineoplastic radiation therapy: Secondary | ICD-10-CM | POA: Diagnosis not present

## 2020-08-17 DIAGNOSIS — C719 Malignant neoplasm of brain, unspecified: Secondary | ICD-10-CM | POA: Diagnosis not present

## 2020-08-18 DIAGNOSIS — C719 Malignant neoplasm of brain, unspecified: Secondary | ICD-10-CM | POA: Diagnosis not present

## 2020-08-18 DIAGNOSIS — C7951 Secondary malignant neoplasm of bone: Secondary | ICD-10-CM | POA: Diagnosis not present

## 2020-08-18 DIAGNOSIS — Z510 Encounter for antineoplastic radiation therapy: Secondary | ICD-10-CM | POA: Diagnosis not present

## 2020-08-19 DIAGNOSIS — Z510 Encounter for antineoplastic radiation therapy: Secondary | ICD-10-CM | POA: Diagnosis not present

## 2020-08-19 DIAGNOSIS — H6690 Otitis media, unspecified, unspecified ear: Secondary | ICD-10-CM | POA: Diagnosis not present

## 2020-08-19 DIAGNOSIS — C719 Malignant neoplasm of brain, unspecified: Secondary | ICD-10-CM | POA: Diagnosis not present

## 2020-08-19 DIAGNOSIS — C7951 Secondary malignant neoplasm of bone: Secondary | ICD-10-CM | POA: Diagnosis not present

## 2020-08-19 DIAGNOSIS — L819 Disorder of pigmentation, unspecified: Secondary | ICD-10-CM | POA: Diagnosis not present

## 2020-08-19 DIAGNOSIS — K2289 Other specified disease of esophagus: Secondary | ICD-10-CM | POA: Diagnosis not present

## 2020-08-19 DIAGNOSIS — K219 Gastro-esophageal reflux disease without esophagitis: Secondary | ICD-10-CM | POA: Diagnosis not present

## 2020-08-19 DIAGNOSIS — Z9221 Personal history of antineoplastic chemotherapy: Secondary | ICD-10-CM | POA: Diagnosis not present

## 2020-08-19 DIAGNOSIS — Z95828 Presence of other vascular implants and grafts: Secondary | ICD-10-CM | POA: Diagnosis not present

## 2020-08-19 DIAGNOSIS — R519 Headache, unspecified: Secondary | ICD-10-CM | POA: Diagnosis not present

## 2020-08-19 DIAGNOSIS — Z923 Personal history of irradiation: Secondary | ICD-10-CM | POA: Diagnosis not present

## 2020-08-21 IMAGING — MR MR LUMBAR SPINE W/O CM
5 of 6 series · 31 of 48 positions shown · non-contrast
Comparison: No pertinent prior studies available for comparison.

CLINICAL DATA: Provided history: 7 days postop from L4-L5
laminectomy with tumor resection. Concern for drainage and new
left-sided sciatica.

EXAM:
MRI LUMBAR SPINE WITHOUT CONTRAST
TECHNIQUE: Multiplanar, multisequence MR imaging of the lumbar spine was
performed. No intravenous contrast was administered.

[Series 5: T2 · sagittal · 4.0mm · 0.81mm/px · 6 of 17 slices shown (1 of 2)]
[im 1/17]
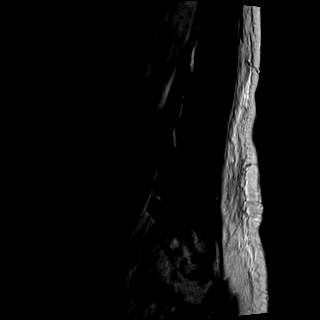
[im 4/17]
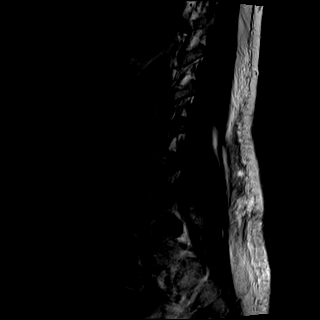
[im 7/17]
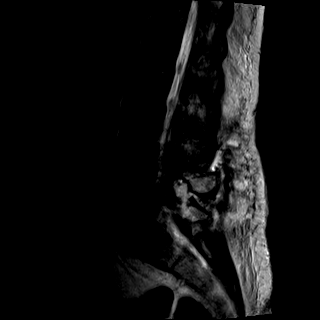
[im 10/17]
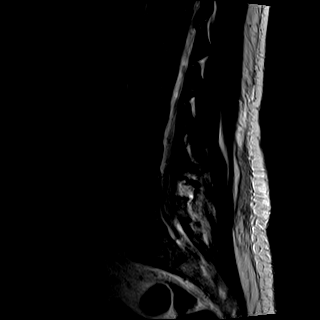
[im 13/17]
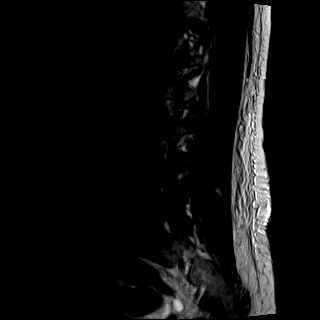
[im 17/17]
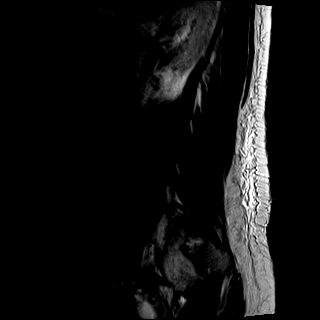

[Series 6: T1 · sagittal · 4.0mm · 0.81mm/px · 6 of 17 slices shown (1 of 3)]
[im 1/17]
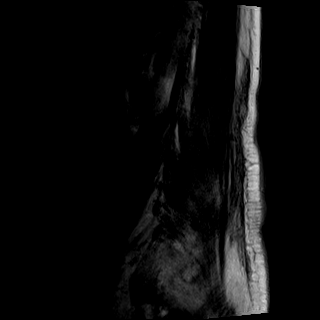
[im 4/17]
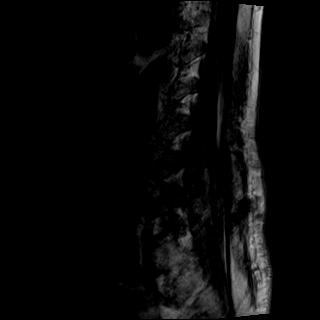
[im 7/17]
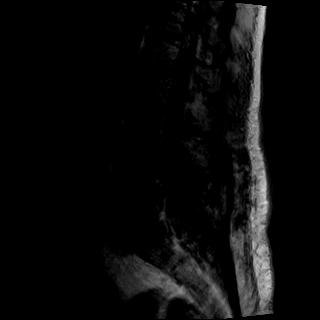
[im 10/17]
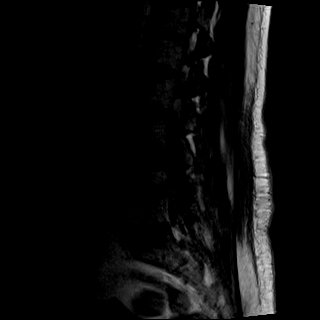
[im 13/17]
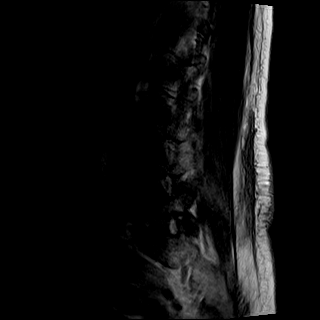
[im 17/17]
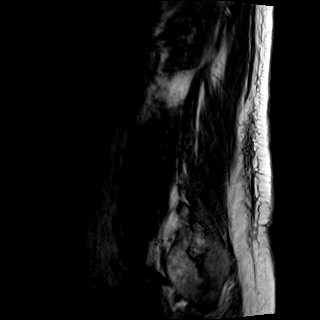

[Series 8: T2 · axial · 4.0mm · 0.78mm/px · z∈[-95,+111]mm · 9 of 36 slices shown (2 of 2)]
[im 1/36]
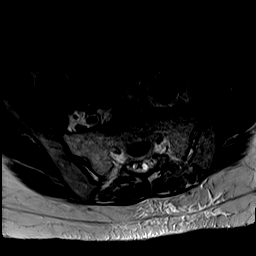
[im 7/36]
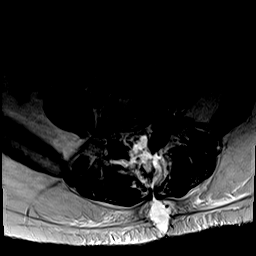
[im 10/36]
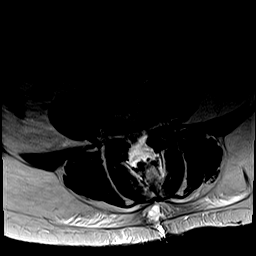
[im 16/36]
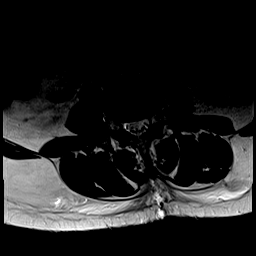
[im 20/36]
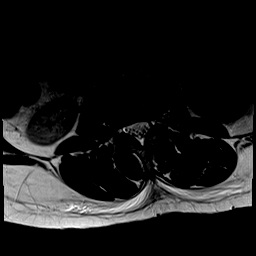
[im 26/36]
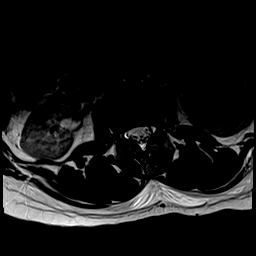
[im 29/36]
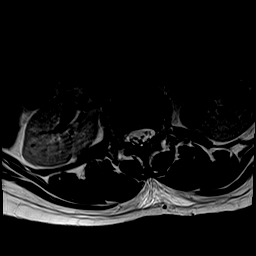
[im 32/36]
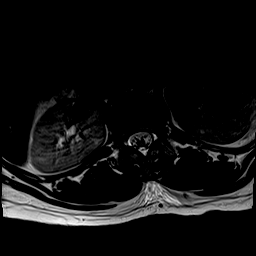
[im 36/36]
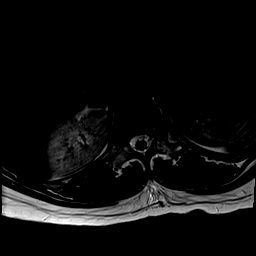

[Series 9: T1 · axial · 4.0mm · 0.39mm/px · z∈[-95,+111]mm · 9 of 36 slices shown (2 of 3)]
[im 1/36]
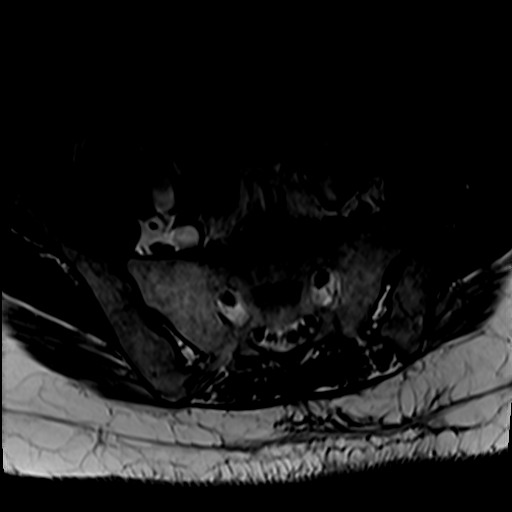
[im 7/36]
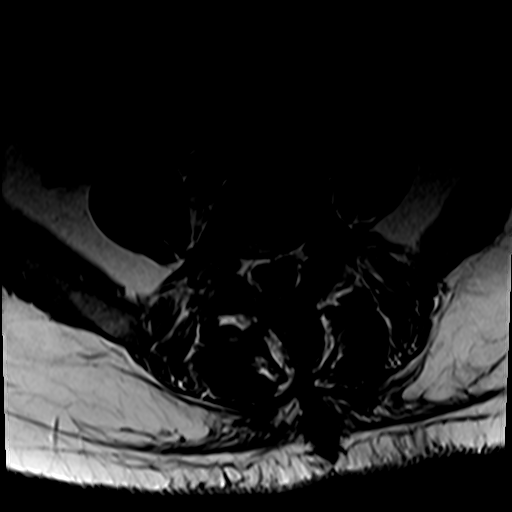
[im 10/36]
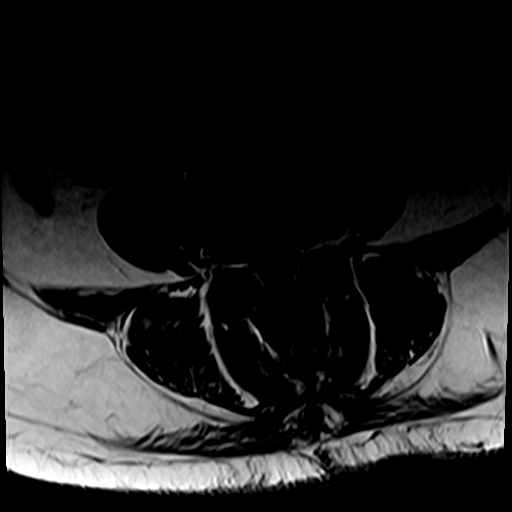
[im 16/36]
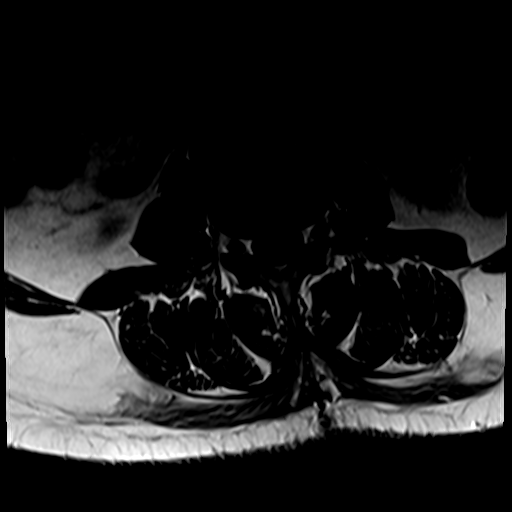
[im 20/36]
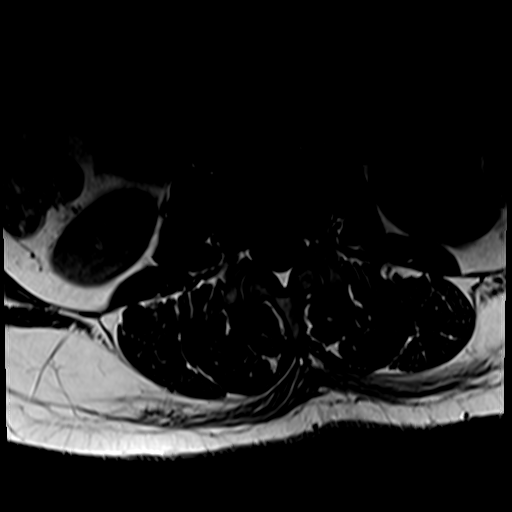
[im 26/36]
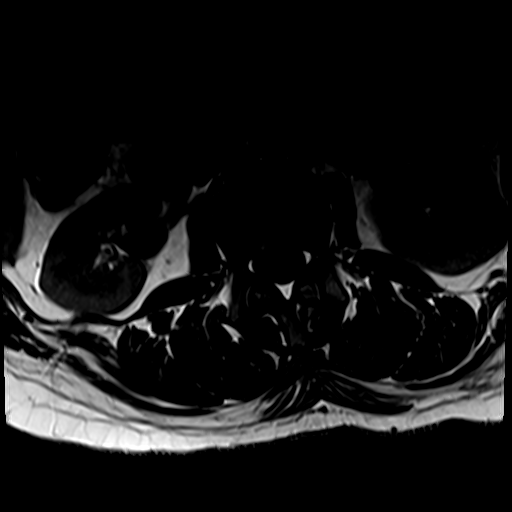
[im 29/36]
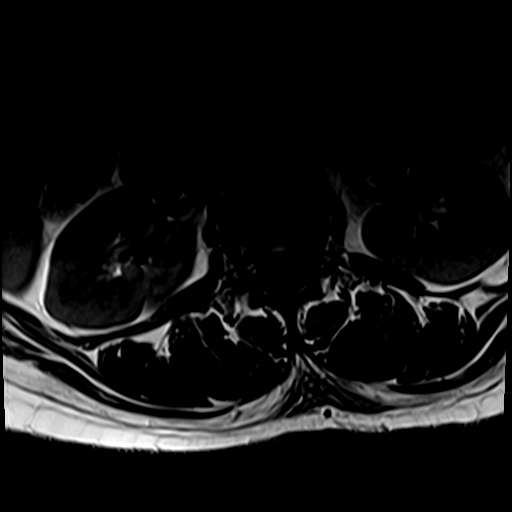
[im 32/36]
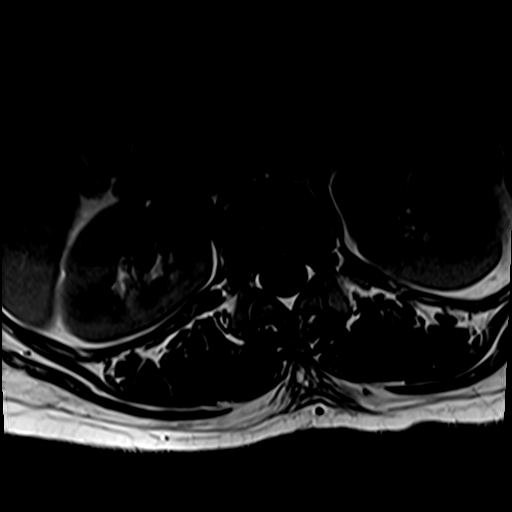
[im 36/36]
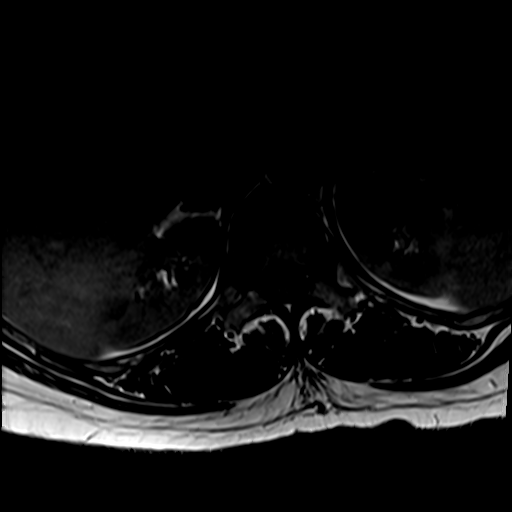

[Series 10: T1 · sagittal · 4.0mm · 1.02mm/px · 1 of 17 slices shown (3 of 3)]
[im 1/17]
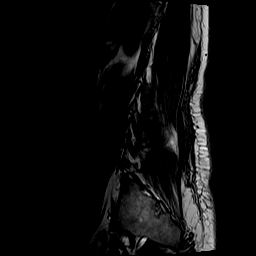

[31 of 48 positions shown; findings below may reference images not displayed]

FINDINGS: Mild intermittent motion degradation.

Segmentation: For the purposes of this dictation, five lumbar
vertebrae are assumed and the caudal most well-formed intervertebral
disc is designated L5-S1.

Alignment: Straightening of the expected lumbar lordosis. No
significant spondylolisthesis.

Vertebrae: Vertebral body height is maintained. Post laminectomy
changes at the L4-L5 level. L5 vertebral body hemangioma.

Conus medullaris and cauda equina: Conus extends to the L1 level. No
signal abnormality within the visualized distal spinal cord.

Paraspinal and other soft tissues: Postoperative changes to the
paraspinal and dorsal soft tissues centered at the L4-L5 level.
There is a fluid collection within the midline subcutaneous soft
tissues spanning the L3-L5 levels. This collection measures 3.0 x
1.3 x 7.8 cm (AP x TV x CC) (for instance as seen on series 8, image
30) (series 7, image 14). Additionally, this collection extends to,
and may communicate with, the L4-L5 laminectomy bed.

Disc levels:

Intervertebral disc height and hydration are preserved.

T12-L1: Mild facet arthrosis. No significant disc herniation or
stenosis.

L1-L2: Mild facet arthrosis. No significant disc herniation or
stenosis.

L2-L3: Minimal disc bulge. Mild facet arthrosis/ligamentum flavum
hypertrophy. Minimal left subarticular narrowing without nerve root
impingement. Central canal patent. No significant foraminal
stenosis.

L3-L4: Minimal disc bulge. Mild facet arthrosis/ligamentum flavum
hypertrophy. Mild left subarticular narrowing without frank nerve
root impingement. Central canal patent. No significant foraminal
stenosis

L4-L5: Bilateral post-laminectomy changes. Small disc bulge. Facet
hypertrophy. Fluid collection within the dorsal aspect of the spinal
canal and laminectomy bed eccentric to the left. This collection
extends inferiorly into the sacral spine and measures 2.1 x 2.2 x
7.0 cm (AP x TV x CC) (for instance as seen on series 8, image 27)
(series 5, image 10). (Left greater than right). No significant
foraminal stenosis

L5-S1: No significant disc herniation. Mild facet arthrosis. Fluid
collection within the posterior aspect of the spinal canal measuring
6 mm in AP dimension. Overall moderate effacement of the thecal sac
at this level and within the visualized upper sacral spine related
to the dorsal fluid collection. The fluid collection also encroaches
upon the descending left S1 nerve root (series 8, image 32). No
significant foraminal stenosis.

These results were called by telephone at the time of interpretation
on 12/07/2019 at [DATE] to provider KUTAISKI GEORGIA , who verbally
acknowledged these results.
IMPRESSION: At L4-L5, there are post laminectomy changes. There is a sizable
fluid collection measuring 2.1 x 2.2 x 7.0 cm (AP x TV x CC) within
the posterior aspect of the spinal canal at this level, within the
laminectomy bed at this level, and extending inferiorly within the
dorsal aspect of the spinal canal to the upper sacral levels.
Resultant severe effacement of the thecal sac at L4-L5 (greater on
the left). Additionally, there is a 2.1 x 2.2 x 7.0 cm fluid
collection within the midline dorsal subcutaneous soft tissues which
extends to, and may communicate with, the L4-L5 laminectomy bed.
This constellation of findings is highly suspicious for CSF leak.

The fluid collection within the dorsal aspect of the spinal canal
also contributes to overall moderate effacement of the thecal sac at
the L5-S1 level and within the visualized upper sacral spine.
Additionally, the fluid collection encroaches upon the descending
left S1 nerve root.

## 2020-08-22 DIAGNOSIS — Z923 Personal history of irradiation: Secondary | ICD-10-CM | POA: Diagnosis not present

## 2020-08-22 DIAGNOSIS — L819 Disorder of pigmentation, unspecified: Secondary | ICD-10-CM | POA: Diagnosis not present

## 2020-08-22 DIAGNOSIS — C7951 Secondary malignant neoplasm of bone: Secondary | ICD-10-CM | POA: Diagnosis not present

## 2020-08-22 DIAGNOSIS — R519 Headache, unspecified: Secondary | ICD-10-CM | POA: Diagnosis not present

## 2020-08-22 DIAGNOSIS — H6690 Otitis media, unspecified, unspecified ear: Secondary | ICD-10-CM | POA: Diagnosis not present

## 2020-08-22 DIAGNOSIS — K219 Gastro-esophageal reflux disease without esophagitis: Secondary | ICD-10-CM | POA: Diagnosis not present

## 2020-08-22 DIAGNOSIS — K2289 Other specified disease of esophagus: Secondary | ICD-10-CM | POA: Diagnosis not present

## 2020-08-22 DIAGNOSIS — Z95828 Presence of other vascular implants and grafts: Secondary | ICD-10-CM | POA: Diagnosis not present

## 2020-08-22 DIAGNOSIS — C719 Malignant neoplasm of brain, unspecified: Secondary | ICD-10-CM | POA: Diagnosis not present

## 2020-08-22 DIAGNOSIS — Z9221 Personal history of antineoplastic chemotherapy: Secondary | ICD-10-CM | POA: Diagnosis not present

## 2020-08-22 DIAGNOSIS — Z510 Encounter for antineoplastic radiation therapy: Secondary | ICD-10-CM | POA: Diagnosis not present

## 2020-08-23 DIAGNOSIS — C7951 Secondary malignant neoplasm of bone: Secondary | ICD-10-CM | POA: Diagnosis not present

## 2020-08-23 DIAGNOSIS — Z923 Personal history of irradiation: Secondary | ICD-10-CM | POA: Diagnosis not present

## 2020-08-23 DIAGNOSIS — Z9221 Personal history of antineoplastic chemotherapy: Secondary | ICD-10-CM | POA: Diagnosis not present

## 2020-08-23 DIAGNOSIS — C719 Malignant neoplasm of brain, unspecified: Secondary | ICD-10-CM | POA: Diagnosis not present

## 2020-08-23 DIAGNOSIS — K2289 Other specified disease of esophagus: Secondary | ICD-10-CM | POA: Diagnosis not present

## 2020-08-23 DIAGNOSIS — Z95828 Presence of other vascular implants and grafts: Secondary | ICD-10-CM | POA: Diagnosis not present

## 2020-08-23 DIAGNOSIS — R519 Headache, unspecified: Secondary | ICD-10-CM | POA: Diagnosis not present

## 2020-08-23 DIAGNOSIS — Z510 Encounter for antineoplastic radiation therapy: Secondary | ICD-10-CM | POA: Diagnosis not present

## 2020-08-23 DIAGNOSIS — H6690 Otitis media, unspecified, unspecified ear: Secondary | ICD-10-CM | POA: Diagnosis not present

## 2020-08-23 DIAGNOSIS — L819 Disorder of pigmentation, unspecified: Secondary | ICD-10-CM | POA: Diagnosis not present

## 2020-08-23 DIAGNOSIS — K219 Gastro-esophageal reflux disease without esophagitis: Secondary | ICD-10-CM | POA: Diagnosis not present

## 2020-08-24 DIAGNOSIS — Z9221 Personal history of antineoplastic chemotherapy: Secondary | ICD-10-CM | POA: Diagnosis not present

## 2020-08-24 DIAGNOSIS — K2289 Other specified disease of esophagus: Secondary | ICD-10-CM | POA: Diagnosis not present

## 2020-08-24 DIAGNOSIS — C7951 Secondary malignant neoplasm of bone: Secondary | ICD-10-CM | POA: Diagnosis not present

## 2020-08-24 DIAGNOSIS — L819 Disorder of pigmentation, unspecified: Secondary | ICD-10-CM | POA: Diagnosis not present

## 2020-08-24 DIAGNOSIS — H6690 Otitis media, unspecified, unspecified ear: Secondary | ICD-10-CM | POA: Diagnosis not present

## 2020-08-24 DIAGNOSIS — C719 Malignant neoplasm of brain, unspecified: Secondary | ICD-10-CM | POA: Diagnosis not present

## 2020-08-24 DIAGNOSIS — Z510 Encounter for antineoplastic radiation therapy: Secondary | ICD-10-CM | POA: Diagnosis not present

## 2020-08-24 DIAGNOSIS — Z923 Personal history of irradiation: Secondary | ICD-10-CM | POA: Diagnosis not present

## 2020-08-24 DIAGNOSIS — K219 Gastro-esophageal reflux disease without esophagitis: Secondary | ICD-10-CM | POA: Diagnosis not present

## 2020-08-24 DIAGNOSIS — Z95828 Presence of other vascular implants and grafts: Secondary | ICD-10-CM | POA: Diagnosis not present

## 2020-08-24 DIAGNOSIS — R519 Headache, unspecified: Secondary | ICD-10-CM | POA: Diagnosis not present

## 2020-08-25 DIAGNOSIS — C719 Malignant neoplasm of brain, unspecified: Secondary | ICD-10-CM | POA: Diagnosis not present

## 2020-08-25 DIAGNOSIS — K219 Gastro-esophageal reflux disease without esophagitis: Secondary | ICD-10-CM | POA: Diagnosis not present

## 2020-08-25 DIAGNOSIS — R519 Headache, unspecified: Secondary | ICD-10-CM | POA: Diagnosis not present

## 2020-08-25 DIAGNOSIS — L819 Disorder of pigmentation, unspecified: Secondary | ICD-10-CM | POA: Diagnosis not present

## 2020-08-25 DIAGNOSIS — K2289 Other specified disease of esophagus: Secondary | ICD-10-CM | POA: Diagnosis not present

## 2020-08-25 DIAGNOSIS — C7951 Secondary malignant neoplasm of bone: Secondary | ICD-10-CM | POA: Diagnosis not present

## 2020-08-25 DIAGNOSIS — Z510 Encounter for antineoplastic radiation therapy: Secondary | ICD-10-CM | POA: Diagnosis not present

## 2020-08-25 DIAGNOSIS — Z923 Personal history of irradiation: Secondary | ICD-10-CM | POA: Diagnosis not present

## 2020-08-25 DIAGNOSIS — Z95828 Presence of other vascular implants and grafts: Secondary | ICD-10-CM | POA: Diagnosis not present

## 2020-08-25 DIAGNOSIS — Z9221 Personal history of antineoplastic chemotherapy: Secondary | ICD-10-CM | POA: Diagnosis not present

## 2020-08-25 DIAGNOSIS — H6690 Otitis media, unspecified, unspecified ear: Secondary | ICD-10-CM | POA: Diagnosis not present

## 2020-09-08 DIAGNOSIS — C719 Malignant neoplasm of brain, unspecified: Secondary | ICD-10-CM | POA: Diagnosis not present

## 2020-11-10 DIAGNOSIS — C718 Malignant neoplasm of overlapping sites of brain: Secondary | ICD-10-CM | POA: Diagnosis not present

## 2020-11-10 DIAGNOSIS — Z982 Presence of cerebrospinal fluid drainage device: Secondary | ICD-10-CM | POA: Diagnosis not present

## 2020-11-10 DIAGNOSIS — Z4541 Encounter for adjustment and management of cerebrospinal fluid drainage device: Secondary | ICD-10-CM | POA: Diagnosis not present

## 2020-11-10 DIAGNOSIS — C719 Malignant neoplasm of brain, unspecified: Secondary | ICD-10-CM | POA: Diagnosis not present

## 2020-11-10 DIAGNOSIS — C701 Malignant neoplasm of spinal meninges: Secondary | ICD-10-CM | POA: Diagnosis not present

## 2020-11-17 DIAGNOSIS — C719 Malignant neoplasm of brain, unspecified: Secondary | ICD-10-CM | POA: Diagnosis not present

## 2020-11-17 DIAGNOSIS — Z79899 Other long term (current) drug therapy: Secondary | ICD-10-CM | POA: Diagnosis not present

## 2020-11-17 DIAGNOSIS — Z982 Presence of cerebrospinal fluid drainage device: Secondary | ICD-10-CM | POA: Diagnosis not present

## 2021-01-06 DIAGNOSIS — C719 Malignant neoplasm of brain, unspecified: Secondary | ICD-10-CM | POA: Diagnosis not present

## 2021-01-06 DIAGNOSIS — Z982 Presence of cerebrospinal fluid drainage device: Secondary | ICD-10-CM | POA: Diagnosis not present

## 2021-01-06 DIAGNOSIS — M5030 Other cervical disc degeneration, unspecified cervical region: Secondary | ICD-10-CM | POA: Diagnosis not present

## 2021-01-09 DIAGNOSIS — C719 Malignant neoplasm of brain, unspecified: Secondary | ICD-10-CM | POA: Diagnosis not present

## 2021-02-17 DIAGNOSIS — C711 Malignant neoplasm of frontal lobe: Secondary | ICD-10-CM | POA: Diagnosis not present

## 2021-02-17 DIAGNOSIS — C719 Malignant neoplasm of brain, unspecified: Secondary | ICD-10-CM | POA: Diagnosis not present

## 2021-02-17 DIAGNOSIS — Z982 Presence of cerebrospinal fluid drainage device: Secondary | ICD-10-CM | POA: Diagnosis not present

## 2021-02-17 DIAGNOSIS — G959 Disease of spinal cord, unspecified: Secondary | ICD-10-CM | POA: Diagnosis not present

## 2021-04-21 NOTE — Telephone Encounter (Signed)
Mirena rcvd/charged 06/01/2019

## 2021-04-28 DIAGNOSIS — C7951 Secondary malignant neoplasm of bone: Secondary | ICD-10-CM | POA: Diagnosis not present

## 2021-04-28 DIAGNOSIS — C719 Malignant neoplasm of brain, unspecified: Secondary | ICD-10-CM | POA: Diagnosis not present

## 2021-04-28 DIAGNOSIS — C7949 Secondary malignant neoplasm of other parts of nervous system: Secondary | ICD-10-CM | POA: Diagnosis not present

## 2021-04-28 DIAGNOSIS — R413 Other amnesia: Secondary | ICD-10-CM | POA: Diagnosis not present

## 2021-05-04 DIAGNOSIS — Z982 Presence of cerebrospinal fluid drainage device: Secondary | ICD-10-CM | POA: Diagnosis not present

## 2021-05-04 DIAGNOSIS — C719 Malignant neoplasm of brain, unspecified: Secondary | ICD-10-CM | POA: Diagnosis not present

## 2021-09-04 DIAGNOSIS — Z510 Encounter for antineoplastic radiation therapy: Secondary | ICD-10-CM | POA: Diagnosis not present

## 2021-09-04 DIAGNOSIS — Z982 Presence of cerebrospinal fluid drainage device: Secondary | ICD-10-CM | POA: Diagnosis not present

## 2021-09-04 DIAGNOSIS — C719 Malignant neoplasm of brain, unspecified: Secondary | ICD-10-CM | POA: Diagnosis not present

## 2022-01-05 DIAGNOSIS — Z982 Presence of cerebrospinal fluid drainage device: Secondary | ICD-10-CM | POA: Diagnosis not present

## 2022-01-05 DIAGNOSIS — Z08 Encounter for follow-up examination after completed treatment for malignant neoplasm: Secondary | ICD-10-CM | POA: Diagnosis not present

## 2022-01-05 DIAGNOSIS — Z85841 Personal history of malignant neoplasm of brain: Secondary | ICD-10-CM | POA: Diagnosis not present

## 2022-01-05 DIAGNOSIS — C719 Malignant neoplasm of brain, unspecified: Secondary | ICD-10-CM | POA: Diagnosis not present

## 2022-01-05 DIAGNOSIS — R519 Headache, unspecified: Secondary | ICD-10-CM | POA: Diagnosis not present

## 2022-01-05 DIAGNOSIS — R413 Other amnesia: Secondary | ICD-10-CM | POA: Diagnosis not present

## 2022-03-21 ENCOUNTER — Emergency Department
Admission: EM | Admit: 2022-03-21 | Discharge: 2022-03-21 | Disposition: A | Discharge status: Home / Self Care | Payer: Medicare Other | Attending: Emergency Medicine | Admitting: Emergency Medicine

## 2022-03-21 ENCOUNTER — Emergency Department: Payer: Medicare Other

## 2022-03-21 DIAGNOSIS — M545 Low back pain, unspecified: Secondary | ICD-10-CM | POA: Diagnosis not present

## 2022-03-21 DIAGNOSIS — M5441 Lumbago with sciatica, right side: Secondary | ICD-10-CM | POA: Diagnosis not present

## 2022-03-21 DIAGNOSIS — M5432 Sciatica, left side: Secondary | ICD-10-CM

## 2022-03-21 DIAGNOSIS — M5442 Lumbago with sciatica, left side: Secondary | ICD-10-CM | POA: Diagnosis not present

## 2022-03-21 DIAGNOSIS — M25551 Pain in right hip: Secondary | ICD-10-CM | POA: Diagnosis present

## 2022-03-21 MED ORDER — PREDNISONE 20 MG PO TABS
60.0000 mg | ORAL_TABLET | Freq: Once | ORAL | Status: AC
  Administered 2022-03-21: 60 mg via ORAL
  Filled 2022-03-21: qty 3

## 2022-03-21 MED ORDER — PREDNISONE 10 MG PO TABS: 10.0000 mg | ORAL_TABLET | Freq: Every day | ORAL | 0 refills | Status: AC

## 2022-03-21 NOTE — ED Provider Notes (Signed)
[facility] EMERGENCY DEPARTMENT Provider Note   CSN: [id number] Arrival date & time: 03/21/22  1605     History  Chief Complaint  Patient presents with   Hip Pain    Sheila Moore is a 46 y.o. female.  Presents to the emergency department for evaluation of right lateral hip pain.  She complains of burning pain in the right lateral aspect of her hip.  No falls trauma or injury.  No weakness in the lower extremities but has had some anterior ankle numbness and tingling bilaterally.  No swelling warmth erythema or edema in the lower legs.  No loss of bowel or bladder symptoms.  Lower leg symptoms along the anterior aspect of both ankles has been on and off for the last several months.  HPI     Home Medications Prior to Admission medications   Medication Sig Start Date End Date Taking? Authorizing Provider  predniSONE (DELTASONE) 10 MG tablet Take 1 tablet (10 mg total) by mouth daily. 6,5,4,3,2,1 six day taper 03/21/22  Yes Duanne Guess, PA-C  carbamazepine (CARBATROL) 300 MG 12 hr capsule Take 600 mg by mouth 2 (two) times daily. 09/18/18   [provider]  levETIRAcetam (KEPPRA) 500 MG tablet Take 750-1,000 mg by mouth See admin instructions. Take 2 tablets (1000 mg) by mouth in the morning and take 1 tablets (750 mg) by mouth at night. 09/18/18   [provider]      Allergies    Patient has no known allergies.    Review of Systems   Review of Systems  Physical Exam Updated Vital Signs BP (!) 154/99 (BP Location: Left Arm)   Pulse 88   Temp 98.2 F (36.8 C) (Oral)   Resp 18   Wt 81.6 kg   LMP 06/01/2019 (Exact Date)   SpO2 99%   BMI 29.95 kg/m  Physical Exam Constitutional:      Appearance: She is well-developed.  HENT:     Head: Normocephalic and atraumatic.  Eyes:     Conjunctiva/sclera: Conjunctivae normal.  Cardiovascular:     Rate and Rhythm: Normal rate.  Pulmonary:     Effort: Pulmonary effort is normal. No  respiratory distress.  Musculoskeletal:        General: Normal range of motion.     Cervical back: Normal range of motion.     Comments: Both lower legs show patient has normal range of motion of both hips with no discomfort.  Slightly tender along the lateral aspect of the right hip along the trochanteric bursa.  There is no swelling warmth erythema or edema throughout both lower legs.  She has no tenderness throughout the lower legs.  Negative Homans' sign bilaterally.  2+ dorsalis pedis pulses bilaterally.  She has normal ankle plantarflexion dorsiflexion and no clonus noted in both lower legs.  She is able to actively flex both hips with no signs of weakness.  She has good strength with hip abduction and adduction.  Skin:    General: Skin is warm.     Findings: No rash.  Neurological:     General: No focal deficit present.     Mental Status: She is alert and oriented to person, place, and time.  Psychiatric:        Behavior: Behavior normal.        Thought Content: Thought content normal.     ED Results / Procedures / Treatments   Labs (all labs ordered are listed, but only abnormal  results are displayed) Labs Reviewed - No data to display  EKG None  Radiology DG Lumbar Spine Complete  Result Date: 03/21/2022 CLINICAL DATA:  Low back pain EXAM: LUMBAR SPINE - COMPLETE 4+ VIEW COMPARISON:  MR 12/07/2019 FINDINGS: There is no evidence of lumbar spine fracture. Alignment is normal. Intervertebral disc spaces are maintained. VP shunt tubing partially visualized. IMPRESSION: Negative. Electronically Signed   By: Lucrezia Europe M.D.   On: 03/21/2022 17:03   DG Hip Unilat  With Pelvis 2-3 Views Right  Result Date: 03/21/2022 CLINICAL DATA:  Low back pain EXAM: DG HIP (WITH OR WITHOUT PELVIS) 2-3V RIGHT COMPARISON:  None Available. FINDINGS: There is no evidence of hip fracture or dislocation. There is no evidence of arthropathy or other focal bone abnormality. Presumed VP shunt catheter  loops in the lower pelvis, proximal margin not visualized. IMPRESSION: Negative. Electronically Signed   By: Lucrezia Europe M.D.   On: 03/21/2022 16:56    Procedures Procedures    Medications Ordered in ED Medications  predniSONE (DELTASONE) tablet 60 mg (60 mg Oral Given 03/21/22 1808)    ED Course/ Medical Decision Making/ A&P                           Medical Decision Making Amount and/or Complexity of Data Reviewed Radiology: ordered.  Risk Prescription drug management.   46 year old female with some complaints of right lateral hip pain as well as bilateral ankle pain, numbness and tingling that comes and goes.  She has no swelling warmth erythema or edema in the lower extremities.  She has normal range of motion with no weakness or neurological deficits in the lower extremities.  X-rays of the lumbar spine and right hip independently reviewed by me today show no evidence of acute bony abnormality.  Patient is placed on a prednisone taper, she will follow-up with neurosurgery, states she is established with neurosurgeon.  She understands signs and symptoms to return to the ER for. Final Clinical Impression(s) / ED Diagnoses Final diagnoses:  Bilateral sciatica    Rx / DC Orders ED Discharge Orders          Ordered    predniSONE (DELTASONE) 10 MG tablet  Daily        03/21/22 1810              Renata Caprice 03/21/22 1814    Harvest Dark, MD 03/21/22 2218

## 2022-03-21 NOTE — Discharge Instructions (Addendum)
Please take prednisone as prescribed.  After completion of prednisone you may continue with ibuprofen and/or Tylenol.  Call neurosurgery to schedule follow-up appointment.  Return to the ER if you develop any severe pain, weakness or any urgent changes in your health

## 2022-03-21 NOTE — ED Provider Triage Note (Signed)
Emergency Medicine Provider Triage Evaluation Note  Sheila Moore , a 46 y.o. female  was evaluated in triage.  Pt complains of hip pain, history of spinal tumors and brain tumor which she did have radiation on the brain 4.  Followed at [facility].  Review of Systems  Positive:  Negative:   Physical Exam  BP (!) 154/99 (BP Location: Left Arm)   Pulse 88   Temp 98.2 F (36.8 C) (Oral)   Resp 18   Wt 81.6 kg   LMP 06/01/2019 (Exact Date)   SpO2 99%   BMI 29.95 kg/m  Gen:   Awake, no distress   Resp:  Normal effort  MSK:   Moves extremities without difficulty  Other:    Medical Decision Making  Medically screening exam initiated at 4:21 PM.  Appropriate orders placed.  CHERRY TURLINGTON was informed that the remainder of the evaluation will be completed by another provider, this initial triage assessment does not replace that evaluation, and the importance of remaining in the ED until their evaluation is complete.     Versie Starks, PA-C 03/21/22 1622

## 2022-03-21 NOTE — ED Triage Notes (Signed)
Pt sts that she is having bilat foot pain with hip pain off and on for the last three months. 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 Pt sts that she has had multiple procedures concerning them.

## 2022-04-02 ENCOUNTER — Telehealth: Payer: Self-pay

## 2022-04-02 NOTE — Telephone Encounter (Signed)
     Patient  visit on 11/1  at [facility] you been able to follow up with your primary care physician? YES  The patient was or was not able to obtain any needed medicine or equipment. YES  Are there diet recommendations that you are having difficulty following? NA  Patient expresses understanding of discharge instructions and education provided has no other needs at this time. [place], [facility], Care Management  [phone] [address], [place], [place] [address] Phone: [phone] Email: [email or web address]

## 2022-05-04 DIAGNOSIS — L299 Pruritus, unspecified: Secondary | ICD-10-CM | POA: Diagnosis not present

## 2022-05-04 DIAGNOSIS — G96198 Other disorders of meninges, not elsewhere classified: Secondary | ICD-10-CM | POA: Diagnosis not present

## 2022-05-04 DIAGNOSIS — Z4541 Encounter for adjustment and management of cerebrospinal fluid drainage device: Secondary | ICD-10-CM | POA: Diagnosis not present

## 2022-05-04 DIAGNOSIS — Z982 Presence of cerebrospinal fluid drainage device: Secondary | ICD-10-CM | POA: Diagnosis not present

## 2022-05-04 DIAGNOSIS — Z85841 Personal history of malignant neoplasm of brain: Secondary | ICD-10-CM | POA: Diagnosis not present

## 2022-05-04 DIAGNOSIS — R519 Headache, unspecified: Secondary | ICD-10-CM | POA: Diagnosis not present

## 2022-05-04 DIAGNOSIS — C719 Malignant neoplasm of brain, unspecified: Secondary | ICD-10-CM | POA: Diagnosis not present

## 2023-04-03 DIAGNOSIS — G40109 Localization-related (focal) (partial) symptomatic epilepsy and epileptic syndromes with simple partial seizures, not intractable, without status epilepticus: Secondary | ICD-10-CM | POA: Diagnosis not present

## 2023-04-03 DIAGNOSIS — Z982 Presence of cerebrospinal fluid drainage device: Secondary | ICD-10-CM | POA: Diagnosis not present
